# Patient Record
Sex: Female | Born: 1975 | Race: White | Hispanic: No | Marital: Married | Smoking: Never smoker
Health system: Southern US, Community
[De-identification: ages and names within clinical notes are randomized; demographics above are authoritative.]

## PROBLEM LIST (undated history)

## (undated) DIAGNOSIS — N946 Dysmenorrhea, unspecified: Secondary | ICD-10-CM

## (undated) DIAGNOSIS — T7840XA Allergy, unspecified, initial encounter: Secondary | ICD-10-CM

## (undated) DIAGNOSIS — E559 Vitamin D deficiency, unspecified: Secondary | ICD-10-CM

## (undated) DIAGNOSIS — R5383 Other fatigue: Secondary | ICD-10-CM

## (undated) DIAGNOSIS — B3731 Acute candidiasis of vulva and vagina: Secondary | ICD-10-CM

## (undated) DIAGNOSIS — N920 Excessive and frequent menstruation with regular cycle: Secondary | ICD-10-CM

## (undated) DIAGNOSIS — J45909 Unspecified asthma, uncomplicated: Secondary | ICD-10-CM

## (undated) DIAGNOSIS — K219 Gastro-esophageal reflux disease without esophagitis: Secondary | ICD-10-CM

## (undated) DIAGNOSIS — E785 Hyperlipidemia, unspecified: Secondary | ICD-10-CM

## (undated) DIAGNOSIS — B373 Candidiasis of vulva and vagina: Secondary | ICD-10-CM

## (undated) HISTORY — DX: Gastro-esophageal reflux disease without esophagitis: K21.9

## (undated) HISTORY — DX: Unspecified asthma, uncomplicated: J45.909

## (undated) HISTORY — DX: Candidiasis of vulva and vagina: B37.3

## (undated) HISTORY — DX: Allergy, unspecified, initial encounter: T78.40XA

## (undated) HISTORY — DX: Dysmenorrhea, unspecified: N94.6

## (undated) HISTORY — DX: Hyperlipidemia, unspecified: E78.5

## (undated) HISTORY — DX: Other fatigue: R53.83

## (undated) HISTORY — DX: Excessive and frequent menstruation with regular cycle: N92.0

## (undated) HISTORY — DX: Vitamin D deficiency, unspecified: E55.9

## (undated) HISTORY — DX: Acute candidiasis of vulva and vagina: B37.31

---

## 1998-03-27 ENCOUNTER — Other Ambulatory Visit: Admission: RE | Admit: 1998-03-27 | Discharge: 1998-03-27 | Payer: Self-pay | Admitting: *Deleted

## 1998-05-20 ENCOUNTER — Other Ambulatory Visit: Admission: RE | Admit: 1998-05-20 | Discharge: 1998-05-20 | Payer: Self-pay | Admitting: Obstetrics and Gynecology

## 2007-05-09 ENCOUNTER — Inpatient Hospital Stay: Payer: Self-pay

## 2013-06-25 LAB — HM PAP SMEAR: HM Pap smear: NEGATIVE

## 2013-06-25 LAB — LIPID PANEL
CHOLESTEROL: 211 mg/dL — AB (ref 0–200)
HDL: 48 mg/dL (ref 35–70)
LDL Cholesterol: 127 mg/dL
Triglycerides: 178 mg/dL — AB (ref 40–160)

## 2015-06-04 ENCOUNTER — Encounter: Payer: Self-pay | Admitting: *Deleted

## 2015-06-05 ENCOUNTER — Encounter: Payer: Self-pay | Admitting: Obstetrics and Gynecology

## 2015-06-05 ENCOUNTER — Ambulatory Visit (INDEPENDENT_AMBULATORY_CARE_PROVIDER_SITE_OTHER): Payer: BC Managed Care – PPO | Admitting: Obstetrics and Gynecology

## 2015-06-05 ENCOUNTER — Other Ambulatory Visit: Payer: Self-pay | Admitting: Obstetrics and Gynecology

## 2015-06-05 VITALS — BP 107/76 | HR 60 | Ht 62.0 in | Wt 152.5 lb

## 2015-06-05 DIAGNOSIS — Z01419 Encounter for gynecological examination (general) (routine) without abnormal findings: Secondary | ICD-10-CM | POA: Diagnosis not present

## 2015-06-05 DIAGNOSIS — R5383 Other fatigue: Secondary | ICD-10-CM | POA: Diagnosis not present

## 2015-06-05 DIAGNOSIS — E663 Overweight: Secondary | ICD-10-CM

## 2015-06-05 MED ORDER — FLUCONAZOLE 150 MG PO TABS: 150.0000 mg | ORAL_TABLET | Freq: Once | ORAL | Status: DC

## 2015-06-05 NOTE — Progress Notes (Signed)
Subjective:   Cassandra Greene is a 39 y.o. 363P0 Caucasian female here for a routine well-woman exam.  No LMP recorded. Patient is not currently having periods (Reason: IUD).    Current complaints: weight gain & fatigue PCP: unknown       Does need labs  Social History: Sexual: heterosexual Marital Status: married Living situation: with family Occupation: nurse Tobacco/alcohol: no tobacco use Illicit drugs: no history of illicit drug use  The following portions of the patient's history were reviewed and updated as appropriate: allergies, current medications, past family history, past medical history, past social history, past surgical history and problem list.  Past Medical History Past Medical History  Diagnosis Date  . Heavy periods   . Painful menstrual periods   . Fatigue   . Vitamin D deficiency   . Yeast infection of the vagina     Past Surgical History History reviewed. No pertinent past surgical history.  Gynecologic History G3P0  No LMP recorded. Patient is not currently having periods (Reason: IUD). Contraception: none Last Pap: 2015. Results were: normal Last mammogram: 2015. Results were: normal  Obstetric History OB History  Gravida Para Term Preterm AB SAB TAB Ectopic Multiple Living  3         3    # Outcome Date GA Lbr Len/2nd Weight Sex Delivery Anes PTL Lv  3 Gravida 2008    M Vag-Spont   Y  2 Gravida 2005    M Vag-Spont   Y  1 Gravida 2003    M Vag-Spont   Y      Current Medications Current Outpatient Prescriptions on File Prior to Visit  Medication Sig Dispense Refill  . levonorgestrel (MIRENA) 20 MCG/24HR IUD 1 each by Intrauterine route once.    . ergocalciferol (VITAMIN D2) 50000 UNITS capsule Take 50,000 Units by mouth once a week.     No current facility-administered medications on file prior to visit.    Review of Systems Patient denies any headaches, blurred vision, shortness of breath, chest pain, abdominal pain, problems with  bowel movements, urination, or intercourse.  Objective:  BP 107/76 mmHg  Pulse 60  Ht 5\' 2"  (1.575 m)  Wt 152 lb 8 oz (69.174 kg)  BMI 27.89 kg/m2 Physical Exam  General:  Well developed, well nourished, no acute distress. She is alert and oriented x3. Skin:  Warm and dry Neck:  Midline trachea, no thyromegaly or nodules Cardiovascular: Regular rate and rhythm, no murmur heard Lungs:  Effort normal, all lung fields clear to auscultation bilaterally Breasts:  No dominant palpable mass, retraction, or nipple discharge Abdomen:  Soft, non tender, no hepatosplenomegaly or masses Pelvic:  External genitalia is normal in appearance.  The vagina is normal in appearance. The cervix is bulbous, no CMT.  Thin prep pap is not done . Uterus is felt to be normal size, shape, and contour.  No adnexal masses or tenderness noted. Extremities:  No swelling or varicosities noted Psych:  She has a normal mood and affect  Assessment:   Healthy well-woman exam Obesity Fatigue   Plan:  Labs obtained Desires weight loss plan we offer here- will return the week after christmas to start F/U 1 year for AE, or sooner if needed Mammogram NA  Cassandra Greene, CNM

## 2015-06-05 NOTE — Patient Instructions (Signed)
  Place annual gynecologic exam patient instructions here.  Thank you for enrolling in MyChart. Please follow the instructions below to securely access your online medical record. MyChart allows you to send messages to your doctor, view your test results, manage appointments, and more.   How Do I Sign Up? 1. In your Internet browser, go to [employer] and enter [email or web address]. 2. Click on the Sign Up Now link in the Sign In box. You will see the New Member Sign Up page. 3. Enter your MyChart Access Code exactly as it appears below. You will not need to use this code after you've completed the sign-up process. If you do not sign up before the expiration date, you must request a new code.  MyChart Access Code: [email or web address] Expires: 06/19/2015 12:48 PM  4. Enter your Social Security Number ([id number]) and Date of Birth (mm/dd/yyyy) as indicated and click Submit. You will be taken to the next sign-up page. 5. Create a MyChart ID. This will be your MyChart login ID and cannot be changed, so think of one that is secure and easy to remember. 6. Create a MyChart password. You can change your password at any time. 7. Enter your Password Reset Question and Answer. This can be used at a later time if you forget your password.  8. Enter your e-mail address. You will receive e-mail notification when new information is available in MyChart. 9. Click Sign Up. You can now view your medical record.   Additional Information Remember, MyChart is NOT to be used for urgent needs. For medical emergencies, dial 911.

## 2015-06-06 LAB — COMPREHENSIVE METABOLIC PANEL
A/G RATIO: 1.8 (ref 1.1–2.5)
ALBUMIN: 4.6 g/dL (ref 3.5–5.5)
ALK PHOS: 59 IU/L (ref 39–117)
ALT: 16 IU/L (ref 0–32)
AST: 12 IU/L (ref 0–40)
BUN / CREAT RATIO: 26 — AB (ref 8–20)
BUN: 18 mg/dL (ref 6–20)
Bilirubin Total: 1.1 mg/dL (ref 0.0–1.2)
CO2: 22 mmol/L (ref 18–29)
CREATININE: 0.7 mg/dL (ref 0.57–1.00)
Calcium: 9.3 mg/dL (ref 8.7–10.2)
Chloride: 99 mmol/L (ref 97–106)
GFR calc Af Amer: 127 mL/min/{1.73_m2} (ref >59)
GFR, EST NON AFRICAN AMERICAN: 110 mL/min/{1.73_m2} (ref >59)
GLOBULIN, TOTAL: 2.5 g/dL (ref 1.5–4.5)
Glucose: 82 mg/dL (ref 65–99)
POTASSIUM: 4.1 mmol/L (ref 3.5–5.2)
SODIUM: 136 mmol/L (ref 136–144)
Total Protein: 7.1 g/dL (ref 6.0–8.5)

## 2015-06-06 LAB — THYROID PANEL WITH TSH
FREE THYROXINE INDEX: 1.9 (ref 1.2–4.9)
T3 Uptake Ratio: 28 % (ref 24–39)
T4, Total: 6.7 ug/dL (ref 4.5–12.0)
TSH: 3.95 u[IU]/mL (ref 0.450–4.500)

## 2015-06-06 LAB — LIPID PANEL
CHOLESTEROL TOTAL: 231 mg/dL — AB (ref 100–199)
Chol/HDL Ratio: 4.4 ratio units (ref 0.0–4.4)
HDL: 52 mg/dL (ref >39)
LDL CALC: 147 mg/dL — AB (ref 0–99)
TRIGLYCERIDES: 160 mg/dL — AB (ref 0–149)
VLDL CHOLESTEROL CAL: 32 mg/dL (ref 5–40)

## 2015-06-06 LAB — VITAMIN D 25 HYDROXY (VIT D DEFICIENCY, FRACTURES): Vit D, 25-Hydroxy: 23.4 ng/mL — ABNORMAL LOW (ref 30.0–100.0)

## 2015-06-06 LAB — HEMOGLOBIN A1C
ESTIMATED AVERAGE GLUCOSE: 103 mg/dL
HEMOGLOBIN A1C: 5.2 % (ref 4.8–5.6)

## 2015-06-06 LAB — VITAMIN B12: Vitamin B-12: 566 pg/mL (ref 211–946)

## 2015-06-08 LAB — CYTOLOGY - PAP

## 2015-06-09 ENCOUNTER — Other Ambulatory Visit: Payer: Self-pay | Admitting: Obstetrics and Gynecology

## 2015-06-09 DIAGNOSIS — E7800 Pure hypercholesterolemia, unspecified: Secondary | ICD-10-CM | POA: Insufficient documentation

## 2015-06-09 DIAGNOSIS — E559 Vitamin D deficiency, unspecified: Secondary | ICD-10-CM | POA: Insufficient documentation

## 2015-06-10 ENCOUNTER — Telehealth: Payer: Self-pay | Admitting: *Deleted

## 2015-06-10 NOTE — Telephone Encounter (Signed)
-----   Message from Purcell NailsMelody N Shambley, [place] sent at 06/09/2015  2:57 PM EST ----- Please let her know her vit d is low- I sent in rx for weekly supplements. Will recheck labs in 3 months. Also please mail info on vit D def.  Lipid panel is still high- worse than last year- please work on lowering chol in diet, regular exercise like we discussed and will also recheck in 3 months (fasting). Make sure she is still planning to do weight loss plan her.  All other labs are good. Also remind her I want her to get on mychart.

## 2015-06-10 NOTE — Telephone Encounter (Signed)
Notified pt of results, mailed info on Vit d to pt, she voiced understanding

## 2015-06-23 ENCOUNTER — Ambulatory Visit (INDEPENDENT_AMBULATORY_CARE_PROVIDER_SITE_OTHER): Payer: BC Managed Care – PPO | Admitting: Obstetrics and Gynecology

## 2015-06-23 ENCOUNTER — Encounter: Payer: Self-pay | Admitting: Obstetrics and Gynecology

## 2015-06-23 VITALS — BP 107/68 | HR 78 | Wt 155.9 lb

## 2015-06-23 DIAGNOSIS — E669 Obesity, unspecified: Secondary | ICD-10-CM

## 2015-06-23 DIAGNOSIS — Z30433 Encounter for removal and reinsertion of intrauterine contraceptive device: Secondary | ICD-10-CM

## 2015-06-23 MED ORDER — PHENTERMINE HCL 37.5 MG PO TABS: 37.5000 mg | ORAL_TABLET | Freq: Every day | ORAL | Status: DC

## 2015-06-23 MED ORDER — CYANOCOBALAMIN 1000 MCG/ML IJ SOLN: 1000.0000 ug | Freq: Once | INTRAMUSCULAR | Status: DC

## 2015-06-23 NOTE — Patient Instructions (Signed)

## 2015-06-23 NOTE — Progress Notes (Signed)
Patient ID: Marcille BlancoJennifer L Alleyne, female   DOB: 04/21/1976, 39 y.o.   MRN: [id number]  Marcille BlancoJennifer L Vanallen is a 39 y.o. year old 153P0 Caucasian female who presents for removal and replacement of a Mirena IUD. She was given informed consent for removal and reinsertion of her Mirena. Her Mirena was placed 2010, No LMP recorded. Patient is not currently having periods (Reason: IUD)., and her pregnancy test today was negative.   The risks and benefits of the method and placement have been thouroughly reviewed with the patient and all questions were answered.  Specifically the patient is aware of failure rate of 06/998, expulsion of the IUD and of possible perforation.  The patient is aware of irregular bleeding due to the method and understands the incidence of irregular bleeding diminishes with time.  Signed copy of informed consent in chart.   No LMP recorded. Patient is not currently having periods (Reason: IUD). BP 107/68 mmHg  Pulse 78  Wt 155 lb 14.4 oz (70.716 kg) No results found for this or any previous visit (from the past 24 hour(s)).   Appropriate time out taken. A graves speculum was placed in the vagina.  The cervix was visualized, prepped using Betadine. The strings were visible. They were grasped and the Mirena was easily removed. The cervix was then grasped with a single-tooth tenaculum. The uterus was found to be neutral and it sounded to 8 cm.  Mirena IUD placed per manufacturer's recommendations without complications. The strings were trimmed to 3 cm.  The patient tolerated the procedure well.   The patient was given post procedure instructions, including signs and symptoms of infection and to check for the strings after each menses or each month, and refraining from intercourse or anything in the vagina for 3 days.  She was given a Mirena care card with date Mirena placed, and date Mirena to be removed.  She was also started on weight loss meds previously discussed with Adipex 37.5mg  daily  and B12 102600mcg/ml monthly- RTC in 4-5 weeks for wt check and B12.  Rishab Stoudt Suzan NailerN Aneesha Holloran, CNM

## 2015-07-27 ENCOUNTER — Ambulatory Visit: Payer: BC Managed Care – PPO

## 2016-06-10 ENCOUNTER — Encounter: Payer: BC Managed Care – PPO | Admitting: Obstetrics and Gynecology

## 2016-07-07 ENCOUNTER — Ambulatory Visit (INDEPENDENT_AMBULATORY_CARE_PROVIDER_SITE_OTHER): Payer: BC Managed Care – PPO | Admitting: Obstetrics and Gynecology

## 2016-07-07 ENCOUNTER — Encounter: Payer: Self-pay | Admitting: Obstetrics and Gynecology

## 2016-07-07 VITALS — BP 112/84 | HR 81 | Ht 62.0 in | Wt 164.5 lb

## 2016-07-07 DIAGNOSIS — E669 Obesity, unspecified: Secondary | ICD-10-CM | POA: Diagnosis not present

## 2016-07-07 DIAGNOSIS — Z30431 Encounter for routine checking of intrauterine contraceptive device: Secondary | ICD-10-CM

## 2016-07-07 DIAGNOSIS — Z01419 Encounter for gynecological examination (general) (routine) without abnormal findings: Secondary | ICD-10-CM | POA: Diagnosis not present

## 2016-07-07 DIAGNOSIS — B3731 Acute candidiasis of vulva and vagina: Secondary | ICD-10-CM

## 2016-07-07 DIAGNOSIS — Z01411 Encounter for gynecological examination (general) (routine) with abnormal findings: Secondary | ICD-10-CM | POA: Diagnosis not present

## 2016-07-07 DIAGNOSIS — E782 Mixed hyperlipidemia: Secondary | ICD-10-CM

## 2016-07-07 DIAGNOSIS — B373 Candidiasis of vulva and vagina: Secondary | ICD-10-CM | POA: Diagnosis not present

## 2016-07-07 MED ORDER — BUPROPION HCL ER (XL) 150 MG PO TB24: 150.0000 mg | ORAL_TABLET | Freq: Every day | ORAL | 4 refills | Status: DC

## 2016-07-07 MED ORDER — TERCONAZOLE 0.4 % VA CREA: 1.0000 | TOPICAL_CREAM | Freq: Every day | VAGINAL | 0 refills | Status: DC

## 2016-07-07 NOTE — Patient Instructions (Signed)
 Preventive Care 18-39 Years, Female Preventive care refers to lifestyle choices and visits with your health care provider that can promote health and wellness. What does preventive care include?  A yearly physical exam. This is also called an annual well check.  Dental exams once or twice a year.  Routine eye exams. Ask your health care provider how often you should have your eyes checked.  Personal lifestyle choices, including:  Daily care of your teeth and gums.  Regular physical activity.  Eating a healthy diet.  Avoiding tobacco and drug use.  Limiting alcohol use.  Practicing safe sex.  Taking vitamin and mineral supplements as recommended by your health care provider. What happens during an annual well check? The services and screenings done by your health care provider during your annual well check will depend on your age, overall health, lifestyle risk factors, and family history of disease. Counseling  Your health care provider may ask you questions about your:  Alcohol use.  Tobacco use.  Drug use.  Emotional well-being.  Home and relationship well-being.  Sexual activity.  Eating habits.  Work and work environment.  Method of birth control.  Menstrual cycle.  Pregnancy history. Screening  You may have the following tests or measurements:  Height, weight, and BMI.  Diabetes screening. This is done by checking your blood sugar (glucose) after you have not eaten for a while (fasting).  Blood pressure.  Lipid and cholesterol levels. These may be checked every 5 years starting at age 20.  Skin check.  Hepatitis C blood test.  Hepatitis B blood test.  Sexually transmitted disease (STD) testing.  BRCA-related cancer screening. This may be done if you have a family history of breast, ovarian, tubal, or peritoneal cancers.  Pelvic exam and Pap test. This may be done every 3 years starting at age 21. Starting at age 30, this may be done  every 5 years if you have a Pap test in combination with an HPV test. Discuss your test results, treatment options, and if necessary, the need for more tests with your health care provider. Vaccines  Your health care provider may recommend certain vaccines, such as:  Influenza vaccine. This is recommended every year.  Tetanus, diphtheria, and acellular pertussis (Tdap, Td) vaccine. You may need a Td booster every 10 years.  Varicella vaccine. You may need this if you have not been vaccinated.  HPV vaccine. If you are 26 or younger, you may need three doses over 6 months.  Measles, mumps, and rubella (MMR) vaccine. You may need at least one dose of MMR. You may also need a second dose.  Pneumococcal 13-valent conjugate (PCV13) vaccine. You may need this if you have certain conditions and were not previously vaccinated.  Pneumococcal polysaccharide (PPSV23) vaccine. You may need one or two doses if you smoke cigarettes or if you have certain conditions.  Meningococcal vaccine. One dose is recommended if you are age 19-21 years and a first-year college student living in a residence hall, or if you have one of several medical conditions. You may also need additional booster doses.  Hepatitis A vaccine. You may need this if you have certain conditions or if you travel or work in places where you may be exposed to hepatitis A.  Hepatitis B vaccine. You may need this if you have certain conditions or if you travel or work in places where you may be exposed to hepatitis B.  Haemophilus influenzae type b (Hib) vaccine. You may need   this if you have certain risk factors. Talk to your health care provider about which screenings and vaccines you need and how often you need them. This information is not intended to replace advice given to you by your health care provider. Make sure you discuss any questions you have with your health care provider. Document Released: 08/09/2001 Document Revised:  03/02/2016 Document Reviewed: 04/14/2015 Elsevier Interactive Patient Education  2017 Elsevier Inc.  

## 2016-07-07 NOTE — Progress Notes (Signed)
Subjective:   Cassandra Greene is a 41 y.o. 673P0 Caucasian female here for a routine well-woman exam.  No LMP recorded. Patient is not currently having periods (Reason: IUD).    Current complaints: frustrated over weight gain, feels very emotional and breast tenderness, not sleeping well, no menses with IUD. 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  Past Medical History Past Medical History:  Diagnosis Date  . Fatigue   . Heavy periods   . Painful menstrual periods   . Vitamin D deficiency   . Yeast infection of the vagina     Past Surgical History History reviewed. No pertinent surgical history.  Gynecologic History G3P0  No LMP recorded. Patient is not currently having periods (Reason: IUD). Contraception: IUD Last Pap: 2016. Results were: normal   Obstetric History OB History  Gravida Para Term Preterm AB Living  3         3  SAB TAB Ectopic Multiple Live Births          3    # Outcome Date GA Lbr Len/2nd Weight Sex Delivery Anes PTL Lv  3 Gravida 2008    M Vag-Spont   LIV  2 Gravida 2005    M Vag-Spont   LIV  1 Gravida 2003    M Vag-Spont   LIV      Current Medications Current Outpatient Prescriptions on File Prior to Visit  Medication Sig Dispense Refill  . levonorgestrel (MIRENA) 20 MCG/24HR IUD 1 each by Intrauterine route once.    . cyanocobalamin (,VITAMIN B-12,) 1000 MCG/ML injection Inject 1 mL (1,000 mcg total) into the muscle once. (Patient not taking: Reported on 07/07/2016) 10 mL 1  . ergocalciferol (VITAMIN D2) 50000 UNITS capsule Take  50,000 Units by mouth once a week.    . fluconazole (DIFLUCAN) 150 MG tablet Take 1 tablet (150 mg total) by mouth once. Can take additional dose three days later if symptoms persist (Patient not taking: Reported on 07/07/2016) 1 tablet 3  . phentermine (ADIPEX-P) 37.5 MG tablet Take 1 tablet (37.5 mg total) by mouth daily before breakfast. (Patient not taking: Reported on 07/07/2016) 30 tablet 2   No current facility-administered medications on file prior to visit.     Review of Systems Patient denies any headaches, blurred vision, shortness of breath, chest pain, abdominal pain, problems with bowel movements, urination, or intercourse.  Objective:  BP 112/84   Pulse 81   Ht 5\' 2"  (1.575 m)   Wt 164 lb 8 oz (74.6 kg)   BMI 30.09 kg/m  Physical Exam  General:  Well developed, well nourished, no acute distress. She is alert and oriented x3. Skin:  Warm and dry Neck:  Midline trachea, no thyromegaly or nodules Cardiovascular: Regular rate and rhythm, no murmur heard Lungs:  Effort normal, all lung fields clear to auscultation bilaterally Breasts:  No dominant palpable mass, retraction, or nipple discharge Abdomen:  Soft, non tender, no hepatosplenomegaly or masses Pelvic:  External genitalia is normal in appearance.  The vagina is normal in appearance. The cervix is bulbous, no CMT, with increased green discharge, IUD string  noted.  Thin prep pap is not done . Uterus is felt to be normal size, shape, and contour.  No adnexal masses or tenderness noted.  Microscopic wet-mount exam shows negative for pathogens, normal epithelial cells, lactobacilli. Extremities:  No swelling or varicosities noted Psych:  She has a normal mood and affect  Assessment:   Healthy well-woman exam Obesity IUD check Yeast infection   Plan:  terazol 7 sent in Trial of wellbutrin for weight loss-will return in 6 weeks for recheck F/U 1 year for AE, or sooner if needed   Lorraina Spring Suzan Nailer, CNM

## 2016-07-09 LAB — NMR, LIPOPROFILE
Cholesterol: 224 mg/dL — ABNORMAL HIGH (ref 100–199)
HDL Cholesterol by NMR: 48 mg/dL (ref >39)
HDL Particle Number: 29.7 umol/L — ABNORMAL LOW (ref >=30.5)
LDL Particle Number: 1863 nmol/L — ABNORMAL HIGH (ref <1000)
LDL SIZE: 20.9 nm (ref >20.5)
LDL-C: 154 mg/dL — ABNORMAL HIGH (ref 0–99)
LP-IR Score: 55 — ABNORMAL HIGH (ref <=45)
SMALL LDL PARTICLE NUMBER: 887 nmol/L — AB (ref <=527)
TRIGLYCERIDES BY NMR: 109 mg/dL (ref 0–149)

## 2016-07-09 LAB — COMPREHENSIVE METABOLIC PANEL
A/G RATIO: 1.6 (ref 1.2–2.2)
ALT: 17 IU/L (ref 0–32)
AST: 19 IU/L (ref 0–40)
Albumin: 4.2 g/dL (ref 3.5–5.5)
Alkaline Phosphatase: 54 IU/L (ref 39–117)
BILIRUBIN TOTAL: 1 mg/dL (ref 0.0–1.2)
BUN/Creatinine Ratio: 27 — ABNORMAL HIGH (ref 9–23)
BUN: 19 mg/dL (ref 6–20)
CHLORIDE: 100 mmol/L (ref 96–106)
CO2: 21 mmol/L (ref 18–29)
Calcium: 9 mg/dL (ref 8.7–10.2)
Creatinine, Ser: 0.7 mg/dL (ref 0.57–1.00)
GFR calc Af Amer: 126 mL/min/{1.73_m2} (ref >59)
GFR calc non Af Amer: 109 mL/min/{1.73_m2} (ref >59)
Globulin, Total: 2.6 g/dL (ref 1.5–4.5)
Glucose: 83 mg/dL (ref 65–99)
POTASSIUM: 4.5 mmol/L (ref 3.5–5.2)
Sodium: 138 mmol/L (ref 134–144)
TOTAL PROTEIN: 6.8 g/dL (ref 6.0–8.5)

## 2016-07-09 LAB — TESTOSTERONE, FREE, TOTAL, SHBG
SEX HORMONE BINDING: 39.2 nmol/L (ref 24.6–122.0)
TESTOSTERONE FREE: 1.8 pg/mL (ref 0.0–4.2)
Testosterone: 22 ng/dL (ref 8–48)

## 2016-07-09 LAB — CBC
Hematocrit: 41.2 % (ref 34.0–46.6)
Hemoglobin: 14.4 g/dL (ref 11.1–15.9)
MCH: 30.4 pg (ref 26.6–33.0)
MCHC: 35 g/dL (ref 31.5–35.7)
MCV: 87 fL (ref 79–97)
PLATELETS: 265 10*3/uL (ref 150–379)
RBC: 4.74 x10E6/uL (ref 3.77–5.28)
RDW: 13.1 % (ref 12.3–15.4)
WBC: 7.3 10*3/uL (ref 3.4–10.8)

## 2016-07-09 LAB — VITAMIN D 25 HYDROXY (VIT D DEFICIENCY, FRACTURES): Vit D, 25-Hydroxy: 28 ng/mL — ABNORMAL LOW (ref 30.0–100.0)

## 2016-07-09 LAB — ESTRADIOL: ESTRADIOL: 219.2 pg/mL

## 2016-07-09 LAB — VITAMIN B12: Vitamin B-12: 491 pg/mL (ref 232–1245)

## 2016-07-09 LAB — THYROID PANEL WITH TSH
Free Thyroxine Index: 1.4 (ref 1.2–4.9)
T3 Uptake Ratio: 26 % (ref 24–39)
T4 TOTAL: 5.5 ug/dL (ref 4.5–12.0)
TSH: 2.43 u[IU]/mL (ref 0.450–4.500)

## 2016-07-09 LAB — DHEA-SULFATE: DHEA-SO4: 221.7 ug/dL (ref 57.3–279.2)

## 2016-07-09 LAB — HEMOGLOBIN A1C
Est. average glucose Bld gHb Est-mCnc: 94 mg/dL
Hgb A1c MFr Bld: 4.9 % (ref 4.8–5.6)

## 2016-07-09 LAB — PROLACTIN: PROLACTIN: 11.1 ng/mL (ref 4.8–23.3)

## 2016-07-09 LAB — FERRITIN: FERRITIN: 103 ng/mL (ref 15–150)

## 2016-07-12 ENCOUNTER — Other Ambulatory Visit: Payer: Self-pay | Admitting: Obstetrics and Gynecology

## 2016-07-12 MED ORDER — ERGOCALCIFEROL 1.25 MG (50000 UT) PO CAPS: 50000.0000 [IU] | ORAL_CAPSULE | ORAL | 2 refills | Status: DC

## 2016-08-16 ENCOUNTER — Other Ambulatory Visit: Payer: Self-pay | Admitting: Obstetrics and Gynecology

## 2016-08-16 ENCOUNTER — Telehealth: Payer: Self-pay | Admitting: Obstetrics and Gynecology

## 2016-08-16 MED ORDER — LIDOCAINE HCL 2 % EX GEL: 1.0000 "application " | CUTANEOUS | 2 refills | Status: DC | PRN

## 2016-08-16 MED ORDER — MUPIROCIN 2 % EX OINT: 1.0000 "application " | TOPICAL_OINTMENT | Freq: Two times a day (BID) | CUTANEOUS | 0 refills | Status: DC

## 2016-08-16 NOTE — Telephone Encounter (Signed)
pls advise

## 2016-08-16 NOTE — Telephone Encounter (Signed)
Left pt a detailed message

## 2016-08-16 NOTE — Telephone Encounter (Signed)
Please let her know I sent in a prescription for antibiotic gel & lidocaine gel, please let her know if it is not markedly better by end of week I want to see her to check it out.

## 2016-08-16 NOTE — Telephone Encounter (Signed)
Pr called saying she had a bump on her labia this am.  She popped the bump and now it is red, painful and swollen.  She is in court for the next two days.  She would like to know if you can send something to the pharmacy.  She uses [facility].    Her call back is [phone]  thanks Barth Kirkseri

## 2016-08-19 ENCOUNTER — Encounter: Payer: BC Managed Care – PPO | Admitting: Obstetrics and Gynecology

## 2016-12-15 ENCOUNTER — Encounter: Payer: BC Managed Care – PPO | Admitting: Obstetrics and Gynecology

## 2017-05-31 ENCOUNTER — Ambulatory Visit (INDEPENDENT_AMBULATORY_CARE_PROVIDER_SITE_OTHER): Payer: BC Managed Care – PPO | Admitting: Obstetrics and Gynecology

## 2017-05-31 ENCOUNTER — Encounter: Payer: Self-pay | Admitting: Obstetrics and Gynecology

## 2017-05-31 VITALS — BP 117/84 | HR 93 | Wt 167.0 lb

## 2017-05-31 DIAGNOSIS — H9201 Otalgia, right ear: Secondary | ICD-10-CM | POA: Diagnosis not present

## 2017-05-31 DIAGNOSIS — J019 Acute sinusitis, unspecified: Secondary | ICD-10-CM | POA: Diagnosis not present

## 2017-05-31 NOTE — Patient Instructions (Signed)

## 2017-05-31 NOTE — Progress Notes (Signed)
Subjective:     Patient ID: Cassandra Greene, female   DOB: 03/26/1976, 41 y.o.   MRN: [id number]  HPI Reports onset sore throat and sinus pressure three days ago, followed by right ear pressure and pain, worse this am. Denies fever or green drainage. States son has same symptoms. No history of ear infections, just allergic sinus post nasal drip. Started Flonase last month per ENT doctor suggestion. Has taken motrin and sudafed with some help in symptoms.   Review of Systems Negative except stated above in HPI    Objective:   Physical Exam A&Ox4 Well groomed female Blood pressure 117/84, pulse 93, weight 167 lb (75.8 kg). Skin warm and dry Negative lymphadenopathy Throat slightly red with white patches  Nasal patches red but no drainage noted Ears both clear except bulging membranes    Assessment:     Ear ache secondary to viral sinusitis    Plan:     Switch to tylenol cold and sinus. Add netty pot use 2 times a day. Continue Flonase at this time. RTC if symptoms worsen or fever developes.  Cassandra Greene,CNM

## 2017-06-02 ENCOUNTER — Telehealth: Payer: Self-pay | Admitting: Obstetrics and Gynecology

## 2017-06-02 ENCOUNTER — Other Ambulatory Visit: Payer: Self-pay | Admitting: Obstetrics and Gynecology

## 2017-06-02 MED ORDER — CEFDINIR 300 MG PO CAPS: 300.0000 mg | ORAL_CAPSULE | Freq: Two times a day (BID) | ORAL | 1 refills | Status: DC

## 2017-06-02 NOTE — Telephone Encounter (Signed)
Patient called stating she is feeling worse. Her ears are stopped up more so now she has green mucous coming out her nose, as well as a low grade fever of 100. If an antibiotic can be called in she uses [facility]. Thanks

## 2017-06-02 NOTE — Telephone Encounter (Signed)
Notified pt. 

## 2017-06-02 NOTE — Telephone Encounter (Signed)
pls advise

## 2017-06-02 NOTE — Telephone Encounter (Signed)
I sent in a prescription

## 2017-07-14 ENCOUNTER — Encounter: Payer: BC Managed Care – PPO | Admitting: Obstetrics and Gynecology

## 2017-10-12 ENCOUNTER — Encounter: Payer: BC Managed Care – PPO | Admitting: Obstetrics and Gynecology

## 2017-12-08 ENCOUNTER — Ambulatory Visit: Payer: BC Managed Care – PPO | Admitting: Certified Nurse Midwife

## 2017-12-08 ENCOUNTER — Encounter: Payer: Self-pay | Admitting: Certified Nurse Midwife

## 2017-12-08 VITALS — BP 124/83 | HR 68 | Ht 62.0 in | Wt 167.0 lb

## 2017-12-08 DIAGNOSIS — J029 Acute pharyngitis, unspecified: Secondary | ICD-10-CM

## 2017-12-08 DIAGNOSIS — R490 Dysphonia: Secondary | ICD-10-CM

## 2017-12-08 DIAGNOSIS — R1310 Dysphagia, unspecified: Secondary | ICD-10-CM

## 2017-12-08 DIAGNOSIS — R239 Unspecified skin changes: Secondary | ICD-10-CM | POA: Diagnosis not present

## 2017-12-08 DIAGNOSIS — R635 Abnormal weight gain: Secondary | ICD-10-CM | POA: Diagnosis not present

## 2017-12-08 DIAGNOSIS — Z8349 Family history of other endocrine, nutritional and metabolic diseases: Secondary | ICD-10-CM | POA: Diagnosis not present

## 2017-12-08 NOTE — Progress Notes (Signed)
GYN ENCOUNTER NOTE  Subjective:       Cassandra Greene is a 42 y.o. 703P0 female is here evaluation of the following issues:  1. Family history of thyroid disorder 2. Skin changes 3. Weight changes 4. Throat soreness 5. Hoarseness 6. Intermittent dysphagia  Reports the above symptoms along with throat fullness for the last few months. No relief with home treatment measures.   Denies difficulty breathing or respiratory distress, chest pain, abdominal pain, and leg pain or swelling.    Gynecologic History  No LMP recorded. (Menstrual status: IUD).  Contraception: IUD, Mirena  Last Pap: 2016. Results were: normal  Last mammogram: due.   Obstetric History  OB History  Gravida Para Term Preterm AB Living  3         3  SAB TAB Ectopic Multiple Live Births          3    # Outcome Date GA Lbr Len/2nd Weight Sex Delivery Anes PTL Lv  3 Gravida 2008    M Vag-Spont   LIV  2 Gravida 2005    M Vag-Spont   LIV  1 Gravida 2003    M Vag-Spont   LIV    Past Medical History:  Diagnosis Date  . Fatigue   . Heavy periods   . Painful menstrual periods   . Vitamin D deficiency   . Yeast infection of the vagina     History reviewed. No pertinent surgical history.  Current Outpatient Medications on File Prior to Visit  Medication Sig Dispense Refill  . cetirizine (ZYRTEC) 10 MG tablet Take 10 mg by mouth daily.    Marland Kitchen. ibuprofen (ADVIL,MOTRIN) 200 MG tablet Take 200 mg by mouth every 6 (six) hours as needed.    Marland Kitchen. levonorgestrel (MIRENA) 20 MCG/24HR IUD 1 each by Intrauterine route once.    . ergocalciferol (VITAMIN D2) 50000 units capsule Take 1 capsule (50,000 Units total) by mouth once a week. (Patient not taking: Reported on 12/08/2017) 30 capsule 2   No current facility-administered medications on file prior to visit.     No Known Allergies  Social History   Socioeconomic History  . Marital status: Married    Spouse name: Not on file  . Number of children: Not on file  .  Years of education: Not on file  . Highest education level: Not on file  Occupational History  . Not on file  Social Needs  . Financial resource strain: Not on file  . Food insecurity:    Worry: Not on file    Inability: Not on file  . Transportation needs:    Medical: Not on file    Non-medical: Not on file  Tobacco Use  . Smoking status: Never Smoker  . Smokeless tobacco: Never Used  Substance and Sexual Activity  . Alcohol use: Yes  . Drug use: No  . Sexual activity: Yes    Birth control/protection: IUD    Comment: mirena  Lifestyle  . Physical activity:    Days per week: Not on file    Minutes per session: Not on file  . Stress: Not on file  Relationships  . Social connections:    Talks on phone: Not on file    Gets together: Not on file    Attends religious service: Not on file    Active member of club or organization: Not on file    Attends meetings of clubs or organizations: Not on file    Relationship status: Not  on file  . Intimate partner violence:    Fear of current or ex partner: Not on file    Emotionally abused: Not on file    Physically abused: Not on file    Forced sexual activity: Not on file  Other Topics Concern  . Not on file  Social History Narrative  . Not on file    Family History  Problem Relation Age of Onset  . Thyroid disease Mother   . Hypertension Mother   . Thyroid disease Father   . Hypertension Father   . Thyroid disease Maternal Aunt   . Thyroid disease Maternal Grandmother   . Hypertension Maternal Grandmother   . Thyroid disease Paternal Grandmother     The following portions of the patient's history were reviewed and updated as appropriate: allergies, current medications, past family history, past medical history, past social history, past surgical history and problem list.  Review of Systems  ROS negative except as noted above. Information obtained from patient.   Objective:   BP 124/83   Pulse 68   Ht 5\' 2"   (1.575 m)   Wt 167 lb (75.8 kg)   BMI 30.54 kg/m    CONSTITUTIONAL: Well-developed, well-nourished female in no acute distress.   NECK: Normal range of motion, supple, no masses.  Normal thyroid.   SKIN: Skin is warm and dry. No rash noted. Not diaphoretic. No erythema. No pallor.  NEUROLGIC: Alert and oriented to person, place, and time.   PSYCHIATRIC: Normal mood and affect. Normal behavior. Normal judgment and thought content. . Assessment:   1. Family history of thyroid disorder  - Thyroid Panel With TSH - Thyroglobulin Level - Thyroid peroxidase antibody - US THYROID; Future  2. Skin change  - Thyroid Panel With TSH - Thyroglobulin Level - Thyroid peroxidase antibody - US THYROID; Future  3. Weight gain  - Thyroid Panel With TSH - Thyroglobulin Level - Thyroid peroxidase antibody - US THYROID; Future  4. Throat soreness  - Thyroid Panel With TSH - Thyroglobulin Level - Thyroid peroxidase antibody - US THYROID; Future  5. Hoarseness  - Thyroid Panel With TSH - Thyroglobulin Level - Thyroid peroxidase antibody - US THYROID; Future  6. Dysphagia, unspecified type  - Thyroid Panel With TSH - Thyroglobulin Level - Thyroid peroxidase antibody - US THYROID; Future   Plan:   Labs: see orders.   Will schedule Thyroid US at [facility].   Reviewed red flag symptoms and when to call.   RTC as needed.    Gunnar Bulla, CNM [facility], [facility]

## 2017-12-08 NOTE — Patient Instructions (Signed)
Thyroxine Test Your thyroid is a butterfly-shaped gland located in the lower part of the front of your neck. Your thyroid makes chemical messengers (hormones) that help control your body's energy use. Thyroxine (T4) is the main hormone produced by your thyroid. Some T4 is bound to proteins in your blood. Some remains free (free T4). Your health care provider may test you for T4, free T4, or both. Your health care provider may order a thyroxine test if you have symptoms of an overactive (hyperthyroid) or underactive (hypothyroid) thyroid gland. Symptoms of hyperthyroidism include:  Tremors.  Weight loss.  Anxiety.  Heat intolerance.  Symptoms of hypothyroidism include:  Fatigue.  Weight gain.  Dry skin.  Cold intolerance.  If you have thyroid disease and you are being treated with thyroid hormone, you may have this test to check your thyroid hormone level. If you have thyroid disease and you are pregnant, you may have this test to make sure your hormone levels remain normal during pregnancy. Newborns often have a free T4 test to screen for hypothyroidism. This test requires a blood sample taken from a vein in your hand or arm. How do I prepare for this test? Let your health care provider know about all medicines you are taking, including vitamins, herbs, eye drops, creams, and over-the-counter medicines.  Many drugs can affect your thyroid hormones, including birth control pills and aspirin.  You may need to stop taking some drugs before the test.  Let your health care provider know about any medical conditions you have, such as:  Liver disease.  Any recent illness or stress.  If you are or may be pregnant.  What do the results mean? It is your responsibility to obtain your test results. Ask the lab or department performing the test when and how you will get your results. Contact your health care provider to discuss any questions you have about your results. Range of Normal  Values Ranges for normal values may vary among different labs and hospitals. You should always check with your health care provider after having lab work or other tests done to discuss whether your values are considered within normal limits. Free T4 is measured in nanograms per deciliter (ng/dL). The normal range for free T4 depends on age:  0-4 days: 2-6 ng/dL or 78-29 pmol/L (SI units).  2 weeks to 20 years: 0.8-2 ng/dL or 56-21 pmol/L (SI units).  Adult: 0.8-2.8 ng/dL or 30-86 pmol/L (SI units).  T4 is measured in micrograms per deciliter (mcg/dL). The normal range for T4 depends on age and gender:  1-3 days: 11-22 mcg/dL.  1-2 weeks: 10-16 mcg/dL.  1-12 months: 8-16 mcg/dL.  1-5 years: 7-15 mcg/dL.  5-10 years: 6-13 mcg/dL.  10-15 years: 5-12 mcg/dL.  Adult female: 4-12 mcg/dL or 57-846 nmol/L (SI units).  Adult female: 5-12 mcg/dL or 96-295 nmol/L (SI units).  Any adult older than 60 years: 5-11 mcg/dL or 28-413 nmol/L (SI units).  Meaning of Results Outside Normal Value Ranges Thyroxine levels alone may not provide enough information to diagnose a specific condition. In general:  A high level of thyroxine can indicate hyperthyroidism.  A low level of thyroxine can indicate hypothyroidism.  Discuss the meaning of results outside the normal range with your health care provider. You may need to have additional tests to help your health care provider make a diagnosis. Talk with your health care provider to discuss your results, treatment options, and if necessary, the need for more tests. Talk with your health care  provider if you have any questions about your results. This information is not intended to replace advice given to you by your health care provider. Make sure you discuss any questions you have with your health care provider. Document Released: 07/16/2004 Document Revised: 02/14/2016 Document Reviewed: 10/09/2013 Elsevier Interactive Patient Education  2018  [employer]. T3, Triiodothyronine Test Why am I having this test? The T3 test is used to evaluate thyroid function. This test helps monitor patients who are being treated for a thyroid disorder. It is also used to diagnose different thyroid conditions including hyperthyroidism and thyroiditis. What kind of sample is taken? A blood sample is required for this test. It is usually collected by inserting a needle into a vein. How do I prepare for this test? There is no preparation required for this test. What are the reference ranges? Reference ranges are considered healthy ranges established after testing a large group of healthy people. Reference ranges may vary among different people, labs, and hospitals. It is your responsibility to obtain your test results. Ask the lab or department performing the test when and how you will get your results. Reference ranges are based on age as follows:  1-3 days: 100-740 ng/dL.  1-11 months: 105-245 ng/dL.  1-5 years: 105-270 ng/dL.  6-10 years: 95-240 ng/dL.  11-15 years: 80-215 ng/dL.  16-20 years: 80-210 ng/dL.  20-50 years: 70-205 ng/dL.  Older than 50 years: 40-180 ng/dL.  What do the results mean? Increased levels of T3 may be seen with:  Hyperthyroidism.  Acute thyroiditis.  Taking too much thyroid supplementation.  Decreased levels of T3 may be seen with:  Hypothyroidism.  Pituitary gland abnormality.  Hypothalamus abnormality.  Malnutrition.  Liver disease.  Kidney disease.  Talk with your health care provider to discuss your results, treatment options, and if necessary, the need for more tests. Talk with your health care provider if you have any questions about your results. Talk with your health care provider to discuss your results, treatment options, and if necessary, the need for more tests. Talk with your health care provider if you have any questions about your results. This information is not intended to  replace advice given to you by your health care provider. Make sure you discuss any questions you have with your health care provider. Document Released: 07/16/2004 Document Revised: 02/11/2016 Document Reviewed: 11/06/2013 Elsevier Interactive Patient Education  2018 [employer]. Thyroid-Stimulating Hormone Test Why am I having this test? A thyroid-stimulating hormone (TSH) test is a blood test that is done to measure the level of TSH, also known as thyrotropin, in your blood. TSH is produced by the pituitary gland. The pituitary gland is a small organ located just below the brain, behind your eyes and nasal passages. It is part of a system that monitors and maintains thyroid hormone levels and thyroid gland function. Thyroid hormones affect many body parts and systems, including the system that affects how quickly your body burns fuel for energy. Your health care provider may recommend testing your TSH level if you have signs and symptoms of abnormal thyroid hormone levels. Knowing the level of TSH in your blood can help your health care provider:  Diagnose a thyroid gland or pituitary gland disorder.  Manage your condition and treatment if you have hypothyroidism or hyperthyroidism.  What kind of sample is taken? A blood sample is required for this test. It is usually collected by inserting a needle into a vein. How do I prepare for this test? There is  no preparation required for this test. What are the reference ranges? Reference rangesare considered healthy rangesestablished after testing a large group of healthy people. Reference rangesmay vary among different people, labs, and hospitals. It is your responsibility to obtain your test results. Ask the lab or department performing the test when and how you will get your results. Range of Normal Values:  Adult: 0.3-5 microunits/mL or 0.3-5 milliunits/L (SI units).  Newborn: 473-18 microunits/mL or 3-18 milliunits/L.  Cord: 3-12  microunits/mL or 3-12 milliunits/L.  What do the results mean? A high level of TSH may mean:  Your thyroid gland is not making enough thyroid hormones. When the thyroid gland does not make enough thyroid hormones, the pituitary gland releases TSH into the bloodstream. The higher-than-normal levels of TSH prompt the thyroid gland to release more thyroid hormones.  You are getting an insufficient level of thyroid hormone medicine, if you are receiving this type of treatment.  There is a problem with the pituitary gland (rare).  A low level of TSH can indicate a problem with the pituitary gland. Talk with your health care provider to discuss your results, treatment options, and if necessary, the need for more tests. Talk with your health care provider if you have any questions about your results. Talk with your health care provider to discuss your results, treatment options, and if necessary, the need for more tests. Talk with your health care provider if you have any questions about your results. This information is not intended to replace advice given to you by your health care provider. Make sure you discuss any questions you have with your health care provider. Document Released: 07/08/2004 Document Revised: 02/14/2016 Document Reviewed: 11/06/2013 Elsevier Interactive Patient Education  [employer]2018 Elsevier Inc.

## 2017-12-08 NOTE — Progress Notes (Signed)
Pt is present today due to thyroid issues. Pt has a family hx of thyroid disease on both side of her family mother and father. Pt stated that her skin has changed, wt gain, feeling like something is in her throat and throat soreness.

## 2017-12-15 ENCOUNTER — Encounter: Payer: Self-pay | Admitting: Certified Nurse Midwife

## 2017-12-15 LAB — THYROID PANEL WITH TSH
Free Thyroxine Index: 1.2 (ref 1.2–4.9)
T3 UPTAKE RATIO: 19 % — AB (ref 24–39)
T4 TOTAL: 6.2 ug/dL (ref 4.5–12.0)
TSH: 3.01 u[IU]/mL (ref 0.450–4.500)

## 2017-12-15 LAB — THYROGLOBULIN LEVEL: THYROGLOBULIN (TG-RIA): 16 ng/mL

## 2017-12-15 LAB — THYROID PEROXIDASE ANTIBODY: THYROID PEROXIDASE ANTIBODY: 8 [IU]/mL (ref 0–34)

## 2017-12-26 ENCOUNTER — Ambulatory Visit: Payer: BC Managed Care – PPO

## 2018-03-23 ENCOUNTER — Encounter: Payer: BC Managed Care – PPO | Admitting: Obstetrics and Gynecology

## 2018-03-23 ENCOUNTER — Encounter

## 2018-07-23 ENCOUNTER — Encounter: Payer: Self-pay | Admitting: *Deleted

## 2018-08-30 ENCOUNTER — Other Ambulatory Visit: Payer: Self-pay | Admitting: *Deleted

## 2018-08-30 ENCOUNTER — Telehealth: Payer: Self-pay | Admitting: Obstetrics and Gynecology

## 2018-08-30 ENCOUNTER — Ambulatory Visit (INDEPENDENT_AMBULATORY_CARE_PROVIDER_SITE_OTHER): Payer: BC Managed Care – PPO | Admitting: Obstetrics and Gynecology

## 2018-08-30 ENCOUNTER — Encounter: Payer: Self-pay | Admitting: Obstetrics and Gynecology

## 2018-08-30 ENCOUNTER — Other Ambulatory Visit (HOSPITAL_COMMUNITY)
Admission: RE | Admit: 2018-08-30 | Discharge: 2018-08-30 | Disposition: A | Discharge status: Home / Self Care | Payer: BC Managed Care – PPO | Source: Ambulatory Visit | Attending: Obstetrics and Gynecology | Admitting: Obstetrics and Gynecology

## 2018-08-30 VITALS — BP 127/86 | HR 74 | Ht 62.0 in | Wt 162.1 lb

## 2018-08-30 DIAGNOSIS — Z01419 Encounter for gynecological examination (general) (routine) without abnormal findings: Secondary | ICD-10-CM

## 2018-08-30 DIAGNOSIS — E559 Vitamin D deficiency, unspecified: Secondary | ICD-10-CM | POA: Diagnosis not present

## 2018-08-30 DIAGNOSIS — F419 Anxiety disorder, unspecified: Secondary | ICD-10-CM

## 2018-08-30 MED ORDER — FLUOXETINE HCL 10 MG PO CAPS: 10.0000 mg | ORAL_CAPSULE | Freq: Every day | ORAL | 3 refills | Status: DC

## 2018-08-30 NOTE — Telephone Encounter (Signed)
The patient remembered, on check out, that she needs her medication sent to [facility] instead of [facility], if that can be updated in her chart as well, please advise, thanks.

## 2018-08-30 NOTE — Progress Notes (Signed)
Subjective:   Cassandra Greene is a 43 y.o. G20P0 Caucasian female here for a routine well-woman exam.  No LMP recorded. (Menstrual status: IUD).    Current complaints: struggles with anxiety, job is stressful, has panic like episodes- feels like heart is racing, can't get her breath, fidgity- last for 15-30 minutes. Tends to be worse in the winter months.  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 Tobacco/alcohol: no tobacco use Illicit drugs: no history of illicit drug use  The following portions of the patient's history were reviewed and updated as appropriate: allergies, current medications, past family history, past medical history, past social history, past surgical history and problem list.  Past Medical History Past Medical History:  Diagnosis Date  . Fatigue   . Heavy periods   . Painful menstrual periods   . Vitamin D deficiency   . Yeast infection of the vagina     Past Surgical History No past surgical history on file.  Gynecologic History G3P0  No LMP recorded. (Menstrual status: IUD). Contraception: IUD(placed 05/2015) Last Pap: 2016. Results were: normal Last mammogram: ?. Results were: normal   Obstetric History OB History  Gravida Para Term Preterm AB Living  3         3  SAB TAB Ectopic Multiple Live Births          3    # Outcome Date GA Lbr Len/2nd Weight Sex Delivery Anes PTL Lv  3 Gravida 2008    M Vag-Spont   LIV  2 Gravida 2005    M Vag-Spont   LIV  1 Gravida 2003    M Vag-Spont   LIV    Current  Medications Current Outpatient Medications on File Prior to Visit  Medication Sig Dispense Refill  . levonorgestrel (MIRENA) 20 MCG/24HR IUD 1 each by Intrauterine route once.    . cetirizine (ZYRTEC) 10 MG tablet Take 10 mg by mouth daily.    . ergocalciferol (VITAMIN D2) 50000 units capsule Take 1 capsule (50,000 Units total) by mouth once a week. (Patient not taking: Reported on 12/08/2017) 30 capsule 2  . ibuprofen (ADVIL,MOTRIN) 200 MG tablet Take 200 mg by mouth every 6 (six) hours as needed.     No current facility-administered medications on file prior to visit.     Review of Systems Patient denies any headaches, blurred vision, shortness of breath, chest pain, abdominal pain, problems with bowel movements, urination, or intercourse.  Objective:  BP 127/86   Pulse 74   Ht 5\' 2"  (1.575 m)   Wt 162 lb 1.6 oz (73.5 kg)   BMI 29.65 kg/m  Physical Exam  General:  Well developed, well nourished, no acute distress. She is alert and oriented x3. Skin:  Warm and dry Neck:  Midline trachea, no thyromegaly or nodules Cardiovascular: Regular rate and rhythm, no murmur heard Lungs:  Effort normal, all lung fields clear to auscultation bilaterally Breasts:  No dominant palpable mass, retraction, or nipple discharge Abdomen:  Soft, non  tender, no hepatosplenomegaly or masses Pelvic:  External genitalia is normal in appearance.  The vagina is normal in appearance. The cervix is bulbous, no CMT.  Thin prep pap is done with HR HPV cotesting. Uterus is felt to be normal size, shape, and contour.  No adnexal masses or tenderness noted. IUD string noted Extremities:  No swelling or varicosities noted Psych:  She has a normal mood and affect  Assessment:   Healthy well-woman exam IUD check Anxiety Vit D deficiency  Plan:  Labs obtained- will follow up accordingly Will try prozac for anxiety F/U 1 year for AE, or sooner if needed Mammogram ordered  Jamisen Duerson Suzan Nailer, CNM

## 2018-08-30 NOTE — Telephone Encounter (Signed)
Done-ac 

## 2018-08-30 NOTE — Patient Instructions (Addendum)
 Preventive Care 18-39 Years, Female Preventive care refers to lifestyle choices and visits with your health care provider that can promote health and wellness. What does preventive care include?   A yearly physical exam. This is also called an annual well check.  Dental exams once or twice a year.  Routine eye exams. Ask your health care provider how often you should have your eyes checked.  Personal lifestyle choices, including: ? Daily care of your teeth and gums. ? Regular physical activity. ? Eating a healthy diet. ? Avoiding tobacco and drug use. ? Limiting alcohol use. ? Practicing safe sex. ? Taking vitamin and mineral supplements as recommended by your health care provider. What happens during an annual well check? The services and screenings done by your health care provider during your annual well check will depend on your age, overall health, lifestyle risk factors, and family history of disease. Counseling Your health care provider may ask you questions about your:  Alcohol use.  Tobacco use.  Drug use.  Emotional well-being.  Home and relationship well-being.  Sexual activity.  Eating habits.  Work and work environment.  Method of birth control.  Menstrual cycle.  Pregnancy history. Screening You may have the following tests or measurements:  Height, weight, and BMI.  Diabetes screening. This is done by checking your blood sugar (glucose) after you have not eaten for a while (fasting).  Blood pressure.  Lipid and cholesterol levels. These may be checked every 5 years starting at age 20.  Skin check.  Hepatitis C blood test.  Hepatitis B blood test.  Sexually transmitted disease (STD) testing.  BRCA-related cancer screening. This may be done if you have a family history of breast, ovarian, tubal, or peritoneal cancers.  Pelvic exam and Pap test. This may be done every 3 years starting at age 21. Starting at age 30, this may be done  every 5 years if you have a Pap test in combination with an HPV test. Discuss your test results, treatment options, and if necessary, the need for more tests with your health care provider. Vaccines Your health care provider may recommend certain vaccines, such as:  Influenza vaccine. This is recommended every year.  Tetanus, diphtheria, and acellular pertussis (Tdap, Td) vaccine. You may need a Td booster every 10 years.  Varicella vaccine. You may need this if you have not been vaccinated.  HPV vaccine. If you are 26 or younger, you may need three doses over 6 months.  Measles, mumps, and rubella (MMR) vaccine. You may need at least one dose of MMR. You may also need a second dose.  Pneumococcal 13-valent conjugate (PCV13) vaccine. You may need this if you have certain conditions and were not previously vaccinated.  Pneumococcal polysaccharide (PPSV23) vaccine. You may need one or two doses if you smoke cigarettes or if you have certain conditions.  Meningococcal vaccine. One dose is recommended if you are age 19-21 years and a first-year college student living in a residence hall, or if you have one of several medical conditions. You may also need additional booster doses.  Hepatitis A vaccine. You may need this if you have certain conditions or if you travel or work in places where you may be exposed to hepatitis A.  Hepatitis B vaccine. You may need this if you have certain conditions or if you travel or work in places where you may be exposed to hepatitis B.  Haemophilus influenzae type b (Hib) vaccine. You may need this if   you have certain risk factors. Talk to your health care provider about which screenings and vaccines you need and how often you need them. This information is not intended to replace advice given to you by your health care provider. Make sure you discuss any questions you have with your health care provider. Document Released: 08/09/2001 Document Revised:  01/24/2017 Document Reviewed: 04/14/2015 Elsevier Interactive Patient Education  2019 [employer].  Generalized Anxiety Disorder, Adult Generalized anxiety disorder (GAD) is a mental health disorder. People with this condition constantly worry about everyday events. Unlike normal anxiety, worry related to GAD is not triggered by a specific event. These worries also do not fade or get better with time. GAD interferes with life functions, including relationships, work, and school. GAD can vary from mild to severe. People with severe GAD can have intense waves of anxiety with physical symptoms (panic attacks). What are the causes? The exact cause of GAD is not known. What increases the risk? This condition is more likely to develop in:  Women.  People who have a family history of anxiety disorders.  People who are very shy.  People who experience very stressful life events, such as the death of a loved one.  People who have a very stressful family environment. What are the signs or symptoms? People with GAD often worry excessively about many things in their lives, such as their health and family. They may also be overly concerned about:  Doing well at work.  Being on time.  Natural disasters.  Friendships. Physical symptoms of GAD include:  Fatigue.  Muscle tension or having muscle twitches.  Trembling or feeling shaky.  Being easily startled.  Feeling like your heart is pounding or racing.  Feeling out of breath or like you cannot take a deep breath.  Having trouble falling asleep or staying asleep.  Sweating.  Nausea, diarrhea, or irritable bowel syndrome (IBS).  Headaches.  Trouble concentrating or remembering facts.  Restlessness.  Irritability. How is this diagnosed? Your health care provider can diagnose GAD based on your symptoms and medical history. You will also have a physical exam. The health care provider will ask specific questions about your  symptoms, including how severe they are, when they started, and if they come and go. Your health care provider may ask you about your use of alcohol or drugs, including prescription medicines. Your health care provider may refer you to a mental health specialist for further evaluation. Your health care provider will do a thorough examination and may perform additional tests to rule out other possible causes of your symptoms. To be diagnosed with GAD, a person must have anxiety that:  Is out of his or her control.  Affects several different aspects of his or her life, such as work and relationships.  Causes distress that makes him or her unable to take part in normal activities.  Includes at least three physical symptoms of GAD, such as restlessness, fatigue, trouble concentrating, irritability, muscle tension, or sleep problems. Before your health care provider can confirm a diagnosis of GAD, these symptoms must be present more days than they are not, and they must last for six months or longer. How is this treated? The following therapies are usually used to treat GAD:  Medicine. Antidepressant medicine is usually prescribed for long-term daily control. Antianxiety medicines may be added in severe cases, especially when panic attacks occur.  Talk therapy (psychotherapy). Certain types of talk therapy can be helpful in treating GAD by providing support,  education, and guidance. Options include: ? Cognitive behavioral therapy (CBT). People learn coping skills and techniques to ease their anxiety. They learn to identify unrealistic or negative thoughts and behaviors and to replace them with positive ones. ? Acceptance and commitment therapy (ACT). This treatment teaches people how to be mindful as a way to cope with unwanted thoughts and feelings. ? Biofeedback. This process trains you to manage your body's response (physiological response) through breathing techniques and relaxation methods. You  will work with a therapist while machines are used to monitor your physical symptoms.  Stress management techniques. These include yoga, meditation, and exercise. A mental health specialist can help determine which treatment is best for you. Some people see improvement with one type of therapy. However, other people require a combination of therapies. Follow these instructions at home:  Take over-the-counter and prescription medicines only as told by your health care provider.  Try to maintain a normal routine.  Try to anticipate stressful situations and allow extra time to manage them.  Practice any stress management or self-calming techniques as taught by your health care provider.  Do not punish yourself for setbacks or for not making progress.  Try to recognize your accomplishments, even if they are small.  Keep all follow-up visits as told by your health care provider. This is important. Contact a health care provider if:  Your symptoms do not get better.  Your symptoms get worse.  You have signs of depression, such as: ? A persistently sad, cranky, or irritable mood. ? Loss of enjoyment in activities that used to bring you joy. ? Change in weight or eating. ? Changes in sleeping habits. ? Avoiding friends or family members. ? Loss of energy for normal tasks. ? Feelings of guilt or worthlessness. Get help right away if:  You have serious thoughts about hurting yourself or others. If you ever feel like you may hurt yourself or others, or have thoughts about taking your own life, get help right away. You can go to your nearest emergency department or call:  Your local emergency services (911 in the [place]  A suicide crisis helpline, such as the [place] at 224-768-3927. This is open 24 hours a day. Summary  Generalized anxiety disorder (GAD) is a mental health disorder that involves worry that is not triggered by a specific event.  People  with GAD often worry excessively about many things in their lives, such as their health and family.  GAD may cause physical symptoms such as restlessness, trouble concentrating, sleep problems, frequent sweating, nausea, diarrhea, headaches, and trembling or muscle twitching.  A mental health specialist can help determine which treatment is best for you. Some people see improvement with one type of therapy. However, other people require a combination of therapies. This information is not intended to replace advice given to you by your health care provider. Make sure you discuss any questions you have with your health care provider. Document Released: 10/08/2012 Document Revised: 05/03/2016 Document Reviewed: 05/03/2016 Elsevier Interactive Patient Education  2019 [employer].

## 2018-08-31 LAB — COMPREHENSIVE METABOLIC PANEL
ALT: 20 IU/L (ref 0–32)
AST: 20 IU/L (ref 0–40)
Albumin/Globulin Ratio: 2 (ref 1.2–2.2)
Albumin: 4.6 g/dL (ref 3.8–4.8)
Alkaline Phosphatase: 56 IU/L (ref 39–117)
BUN/Creatinine Ratio: 21 (ref 9–23)
BUN: 16 mg/dL (ref 6–24)
Bilirubin Total: 1.2 mg/dL (ref 0.0–1.2)
CO2: 17 mmol/L — AB (ref 20–29)
Calcium: 9.2 mg/dL (ref 8.7–10.2)
Chloride: 105 mmol/L (ref 96–106)
Creatinine, Ser: 0.76 mg/dL (ref 0.57–1.00)
GFR calc Af Amer: 113 mL/min/{1.73_m2} (ref >59)
GFR calc non Af Amer: 98 mL/min/{1.73_m2} (ref >59)
Globulin, Total: 2.3 g/dL (ref 1.5–4.5)
Glucose: 77 mg/dL (ref 65–99)
Potassium: 4.1 mmol/L (ref 3.5–5.2)
Sodium: 138 mmol/L (ref 134–144)
Total Protein: 6.9 g/dL (ref 6.0–8.5)

## 2018-08-31 LAB — NMR, LIPOPROFILE
Cholesterol, Total: 209 mg/dL — ABNORMAL HIGH (ref 100–199)
HDL Particle Number: 30.5 umol/L (ref >=30.5)
HDL-C: 49 mg/dL (ref >39)
LDL Particle Number: 1651 nmol/L — ABNORMAL HIGH (ref <1000)
LDL Size: 21.1 nm (ref >20.5)
LDL-C: 143 mg/dL — ABNORMAL HIGH (ref 0–99)
LP-IR Score: 32 (ref <=45)
SMALL LDL PARTICLE NUMBER: 688 nmol/L — AB (ref <=527)
Triglycerides: 86 mg/dL (ref 0–149)

## 2018-08-31 LAB — HEMOGLOBIN A1C
Est. average glucose Bld gHb Est-mCnc: 103 mg/dL
Hgb A1c MFr Bld: 5.2 % (ref 4.8–5.6)

## 2018-08-31 LAB — TSH: TSH: 3.11 u[IU]/mL (ref 0.450–4.500)

## 2018-08-31 LAB — VITAMIN D 25 HYDROXY (VIT D DEFICIENCY, FRACTURES): Vit D, 25-Hydroxy: 20.9 ng/mL — ABNORMAL LOW (ref 30.0–100.0)

## 2018-09-04 ENCOUNTER — Other Ambulatory Visit: Payer: Self-pay | Admitting: Obstetrics and Gynecology

## 2018-09-04 LAB — CYTOLOGY - PAP
DIAGNOSIS: NEGATIVE
HPV (WINDOPATH): NOT DETECTED

## 2018-09-04 MED ORDER — ERGOCALCIFEROL 1.25 MG (50000 UT) PO CAPS: 50000.0000 [IU] | ORAL_CAPSULE | ORAL | 2 refills | Status: DC

## 2019-04-19 ENCOUNTER — Other Ambulatory Visit: Payer: Self-pay | Admitting: Obstetrics and Gynecology

## 2019-04-19 MED ORDER — FLUOXETINE HCL 20 MG PO CAPS: 20.0000 mg | ORAL_CAPSULE | Freq: Every day | ORAL | 6 refills | Status: DC

## 2019-08-22 ENCOUNTER — Telehealth: Payer: Self-pay | Admitting: Internal Medicine

## 2019-08-22 NOTE — Telephone Encounter (Signed)
Patient would like to est new patient care with Dr. Lorin Picket. Cassandra Greene's son and Dr. Roby Lofts son went to play school together and Cassandra Greene worked with Dr. Lorin Picket at [facility].

## 2019-08-22 NOTE — Telephone Encounter (Signed)
Message sent to provider 

## 2019-08-28 NOTE — Telephone Encounter (Signed)
Lm to call back and ask for Morrie Sheldon to schedule

## 2019-10-02 ENCOUNTER — Ambulatory Visit (INDEPENDENT_AMBULATORY_CARE_PROVIDER_SITE_OTHER): Payer: BC Managed Care – PPO | Admitting: Internal Medicine

## 2019-10-02 ENCOUNTER — Other Ambulatory Visit: Payer: Self-pay

## 2019-10-02 ENCOUNTER — Encounter: Payer: Self-pay | Admitting: Internal Medicine

## 2019-10-02 VITALS — BP 126/74 | HR 87 | Temp 98.3°F | Resp 16 | Ht 62.0 in | Wt 176.2 lb

## 2019-10-02 DIAGNOSIS — Z124 Encounter for screening for malignant neoplasm of cervix: Secondary | ICD-10-CM

## 2019-10-02 DIAGNOSIS — F439 Reaction to severe stress, unspecified: Secondary | ICD-10-CM | POA: Diagnosis not present

## 2019-10-02 DIAGNOSIS — Z9109 Other allergy status, other than to drugs and biological substances: Secondary | ICD-10-CM

## 2019-10-02 DIAGNOSIS — E559 Vitamin D deficiency, unspecified: Secondary | ICD-10-CM

## 2019-10-02 DIAGNOSIS — E7800 Pure hypercholesterolemia, unspecified: Secondary | ICD-10-CM | POA: Diagnosis not present

## 2019-10-02 DIAGNOSIS — Z1231 Encounter for screening mammogram for malignant neoplasm of breast: Secondary | ICD-10-CM

## 2019-10-02 LAB — COMPREHENSIVE METABOLIC PANEL
ALT: 16 U/L (ref 0–35)
AST: 19 U/L (ref 0–37)
Albumin: 4.3 g/dL (ref 3.5–5.2)
Alkaline Phosphatase: 48 U/L (ref 39–117)
BUN: 21 mg/dL (ref 6–23)
CO2: 25 mEq/L (ref 19–32)
Calcium: 9 mg/dL (ref 8.4–10.5)
Chloride: 105 mEq/L (ref 96–112)
Creatinine, Ser: 0.84 mg/dL (ref 0.40–1.20)
GFR: 74.02 mL/min (ref >60.00)
Glucose, Bld: 88 mg/dL (ref 70–99)
Potassium: 3.9 mEq/L (ref 3.5–5.1)
Sodium: 136 mEq/L (ref 135–145)
Total Bilirubin: 1.1 mg/dL (ref 0.2–1.2)
Total Protein: 6.9 g/dL (ref 6.0–8.3)

## 2019-10-02 LAB — CBC WITH DIFFERENTIAL/PLATELET
Basophils Absolute: 0 10*3/uL (ref 0.0–0.1)
Basophils Relative: 0.6 % (ref 0.0–3.0)
Eosinophils Absolute: 0.1 10*3/uL (ref 0.0–0.7)
Eosinophils Relative: 1.7 % (ref 0.0–5.0)
HCT: 42.2 % (ref 36.0–46.0)
Hemoglobin: 14.6 g/dL (ref 12.0–15.0)
Lymphocytes Relative: 42.6 % (ref 12.0–46.0)
Lymphs Abs: 2.7 10*3/uL (ref 0.7–4.0)
MCHC: 34.5 g/dL (ref 30.0–36.0)
MCV: 89.9 fl (ref 78.0–100.0)
Monocytes Absolute: 0.4 10*3/uL (ref 0.1–1.0)
Monocytes Relative: 6.2 % (ref 3.0–12.0)
Neutro Abs: 3.1 10*3/uL (ref 1.4–7.7)
Neutrophils Relative %: 48.9 % (ref 43.0–77.0)
Platelets: 270 10*3/uL (ref 150.0–400.0)
RBC: 4.7 Mil/uL (ref 3.87–5.11)
RDW: 13 % (ref 11.5–15.5)
WBC: 6.3 10*3/uL (ref 4.0–10.5)

## 2019-10-02 LAB — LIPID PANEL
Cholesterol: 227 mg/dL — ABNORMAL HIGH (ref 0–200)
HDL: 48.3 mg/dL (ref >39.00)
LDL Cholesterol: 148 mg/dL — ABNORMAL HIGH (ref 0–99)
NonHDL: 179.15
Total CHOL/HDL Ratio: 5
Triglycerides: 158 mg/dL — ABNORMAL HIGH (ref 0.0–149.0)
VLDL: 31.6 mg/dL (ref 0.0–40.0)

## 2019-10-02 LAB — TSH: TSH: 2.06 u[IU]/mL (ref 0.35–4.50)

## 2019-10-02 NOTE — Progress Notes (Signed)
Patient ID: Cassandra Greene, female   DOB: 1976/06/11, 44 y.o.   MRN: [id number]   Subjective:    Patient ID: Cassandra Greene, female    DOB: [birth year], 44 y.o.   MRN: [id number]  HPI This visit occurred during the SARS-CoV-2 public health emergency.  Safety protocols were in place, including screening questions prior to the visit, additional usage of staff PPE, and extensive cleaning of exam room while observing appropriate contact time as indicated for disinfecting solutions.  Patient here to establish care.  She previously saw [facility]. Has a history of heavy periods.  Had mirena IUD now.  No heavy periods now.  Documented history of acid reflux.  carbs aggravate.  Discussed diet and exercise.  Breathing stable.  No chest pain.  Had covid 06/2019.  No significant residual problems.  Did notice tingling face.  Sates has not occurred in two weeks.  Smell not back fully.  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  No abdominal pain.  Bowels moving.  Plans to establish with Dr Leafy Ro.  Some increased stress.  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    Past Medical History:  Diagnosis Date  . Allergy   . Asthma   . Fatigue   . GERD (gastroesophageal reflux disease)   . Heavy periods   . Hyperlipidemia   . Painful menstrual periods   . Vitamin D deficiency   . Yeast infection of the vagina    History reviewed. No pertinent surgical history. Family History  Problem Relation Age of Onset  . Thyroid disease Mother   . Hypertension Mother   . Cancer Mother   . Hyperlipidemia Mother   . Thyroid disease Father   . Hypertension Father   . Cancer Father   . Hyperlipidemia Father   . Hearing loss Father   . Thyroid disease Maternal Aunt   . Thyroid disease Maternal Grandmother   . Hypertension Maternal Grandmother   . Heart disease Maternal Grandmother   . Thyroid disease Paternal Grandmother   . Hypertension Brother   . Kidney disease Brother   . Alcohol abuse Maternal Grandfather     . Stroke Maternal Grandfather   . Heart disease Maternal Grandfather   . Hearing loss Paternal Grandfather   . Hypertension Paternal Grandfather   . Hyperlipidemia Paternal Grandfather    Social History   Socioeconomic History  . Marital status: Married    Spouse name: Not on file  . Number of children: 3  . Years of education: Not on file  . Highest education level: Bachelor's degree (e.g., BA, AB, BS)  Occupational History  . Not on file  Tobacco Use  . Smoking status: Never Smoker  . Smokeless tobacco: Never Used  Substance and Sexual Activity  . Alcohol use: Yes  . Drug use: No  . Sexual activity: Yes    Birth control/protection: I.U.D.    Comment: mirena  Other Topics Concern  . Not on file  Social History Narrative  . Not on file   Social Determinants of Health   Financial Resource Strain:   . Difficulty of Paying Living Expenses:   Food Insecurity:   . Worried About Charity fundraiser in the Last Year:   . Arboriculturist in the Last Year:   Transportation Needs:   . Film/video editor (Medical):   Marland Kitchen Lack of Transportation (Non-Medical):   Physical Activity:   . Days of Exercise per Week:   .  Minutes of Exercise per Session:   Stress:   . Feeling of Stress :   Social Connections:   . Frequency of Communication with Friends and Family:   . Frequency of Social Gatherings with Friends and Family:   . Attends Religious Services:   . Active Member of Clubs or Organizations:   . Attends Banker Meetings:   Marland Kitchen Marital Status:     Outpatient Encounter Medications as of 10/02/2019  Medication Sig  . ergocalciferol (VITAMIN D2) 1.25 MG (50000 UT) capsule Take 1 capsule (50,000 Units total) by mouth 2 (two) times a week.  Marland Kitchen FLUoxetine (PROZAC) 20 MG capsule Take 1 capsule (20 mg total) by mouth daily.  Marland Kitchen ibuprofen (ADVIL,MOTRIN) 200 MG tablet Take 200 mg by mouth every 6 (six) hours as needed.  Marland Kitchen levonorgestrel (MIRENA) 20 MCG/24HR IUD 1 each by  Intrauterine route once.  . [DISCONTINUED] cetirizine (ZYRTEC) 10 MG tablet Take 10 mg by mouth daily.   No facility-administered encounter medications on file as of 10/02/2019.    Review of Systems  Constitutional: Negative for appetite change and unexpected weight change.  HENT: Negative for congestion and sinus pressure.        Reports decreased hearing.  Also, smell not back.   Respiratory: Negative for cough, chest tightness and shortness of breath.   Cardiovascular: Negative for chest pain, palpitations and leg swelling.  Gastrointestinal: Negative for abdominal pain, diarrhea, nausea and vomiting.  Genitourinary: Negative for difficulty urinating and dysuria.  Musculoskeletal: Negative for joint swelling and myalgias.  Skin: Negative for color change and rash.  Neurological: Negative for dizziness, light-headedness and headaches.  Psychiatric/Behavioral: Negative for agitation and dysphoric mood.       Objective:    Physical Exam Vitals reviewed.  Constitutional:      General: She is not in acute distress.    Appearance: Normal appearance.  HENT:     Head: Normocephalic and atraumatic.     Right Ear: External ear normal.     Left Ear: External ear normal.  Eyes:     General: No scleral icterus.       Right eye: No discharge.        Left eye: No discharge.     Conjunctiva/sclera: Conjunctivae normal.  Neck:     Thyroid: No thyromegaly.  Cardiovascular:     Rate and Rhythm: Normal rate and regular rhythm.  Pulmonary:     Effort: No respiratory distress.     Breath sounds: Normal breath sounds. No wheezing.  Abdominal:     General: Bowel sounds are normal.     Palpations: Abdomen is soft.     Tenderness: There is no abdominal tenderness.  Musculoskeletal:        General: No swelling or tenderness.     Cervical back: Neck supple. No tenderness.  Lymphadenopathy:     Cervical: No cervical adenopathy.  Skin:    Findings: No erythema or rash.  Neurological:      Mental Status: She is alert.  Psychiatric:        Mood and Affect: Mood normal.        Behavior: Behavior normal.     BP 126/74   Pulse 87   Temp 98.3 F (36.8 C)   Resp 16   Ht 5\' 2"  (1.575 m)   Wt 176 lb 3.2 oz (79.9 kg)   SpO2 97%   BMI 32.23 kg/m  Wt Readings from Last 3 Encounters:  10/02/19 176 lb 3.2  oz (79.9 kg)  08/30/18 162 lb 1.6 oz (73.5 kg)  12/08/17 167 lb (75.8 kg)     Lab Results  Component Value Date   WBC 6.3 10/02/2019   HGB 14.6 10/02/2019   HCT 42.2 10/02/2019   PLT 270.0 10/02/2019   GLUCOSE 88 10/02/2019   CHOL 227 (H) 10/02/2019   TRIG 158.0 (H) 10/02/2019   HDL 48.30 10/02/2019   LDLCALC 148 (H) 10/02/2019   ALT 16 10/02/2019   AST 19 10/02/2019   NA 136 10/02/2019   K 3.9 10/02/2019   CL 105 10/02/2019   CREATININE 0.84 10/02/2019   BUN 21 10/02/2019   CO2 25 10/02/2019   TSH 2.06 10/02/2019   HGBA1C 5.2 08/30/2018       Assessment & Plan:   Problem List Items Addressed This Visit    Breast cancer screening    Was seen Melody Shambley.  Plans to f/u with Dr Dalbert Garnet. Plans to get mammograms through gyn.  Will let me know if a problem.        Cervical cancer screening    Plans to f/u with Dr Dalbert Garnet as outlined.        History of environmental allergies    Stable.  Follow.        Hypercholesterolemia - Primary    Low cholesterol diet and exercise.  Follow lipid panel.  On no medication.        Relevant Orders   CBC with Differential/Platelet (Completed)   Comprehensive metabolic panel (Completed)   TSH (Completed)   Lipid panel (Completed)   Stress    Increased stress.  Overall appears to be doing well.  Follow.        Vitamin D deficiency    Follow vitamin D level.           Dale [place], MD

## 2019-10-03 ENCOUNTER — Encounter: Payer: Self-pay | Admitting: Internal Medicine

## 2019-10-12 ENCOUNTER — Encounter: Payer: Self-pay | Admitting: Internal Medicine

## 2019-10-12 DIAGNOSIS — Z9109 Other allergy status, other than to drugs and biological substances: Secondary | ICD-10-CM | POA: Insufficient documentation

## 2019-10-12 DIAGNOSIS — F439 Reaction to severe stress, unspecified: Secondary | ICD-10-CM | POA: Insufficient documentation

## 2019-10-12 DIAGNOSIS — Z1239 Encounter for other screening for malignant neoplasm of breast: Secondary | ICD-10-CM | POA: Insufficient documentation

## 2019-10-12 DIAGNOSIS — Z124 Encounter for screening for malignant neoplasm of cervix: Secondary | ICD-10-CM | POA: Insufficient documentation

## 2019-10-12 NOTE — Assessment & Plan Note (Signed)
Was seen Cassandra Greene.  Plans to f/u with Dr Dalbert Garnet. Plans to get mammograms through gyn.  Will let me know if a problem.

## 2019-10-12 NOTE — Assessment & Plan Note (Signed)
Increased stress.  Overall appears to be doing well.  Follow.  

## 2019-10-12 NOTE — Assessment & Plan Note (Signed)
Stable.  Follow.   

## 2019-10-12 NOTE — Assessment & Plan Note (Signed)
Low cholesterol diet and exercise.  Follow lipid panel.  On no medication.   

## 2019-10-12 NOTE — Assessment & Plan Note (Signed)
Plans to f/u with Dr Dalbert Garnet as outlined.

## 2019-10-12 NOTE — Assessment & Plan Note (Signed)
Follow vitamin D level.  

## 2019-10-16 ENCOUNTER — Other Ambulatory Visit: Payer: Self-pay | Admitting: Obstetrics and Gynecology

## 2019-10-16 DIAGNOSIS — Z1231 Encounter for screening mammogram for malignant neoplasm of breast: Secondary | ICD-10-CM

## 2019-10-30 ENCOUNTER — Encounter: Payer: Self-pay | Admitting: Internal Medicine

## 2019-11-22 ENCOUNTER — Ambulatory Visit
Admission: RE | Admit: 2019-11-22 | Discharge: 2019-11-22 | Disposition: A | Discharge status: Home / Self Care | Payer: BC Managed Care – PPO | Source: Ambulatory Visit | Attending: Obstetrics and Gynecology | Admitting: Obstetrics and Gynecology

## 2019-11-22 DIAGNOSIS — Z1231 Encounter for screening mammogram for malignant neoplasm of breast: Secondary | ICD-10-CM

## 2019-11-22 IMAGING — MG DIGITAL SCREENING BILAT W/ TOMO W/ CAD
8 series · 8 of 24 positions shown · non-contrast
Comparison: None.

CLINICAL DATA: Screening.

EXAM:
DIGITAL SCREENING BILATERAL MAMMOGRAM WITH TOMO AND CAD

[R MLO synth-2D]
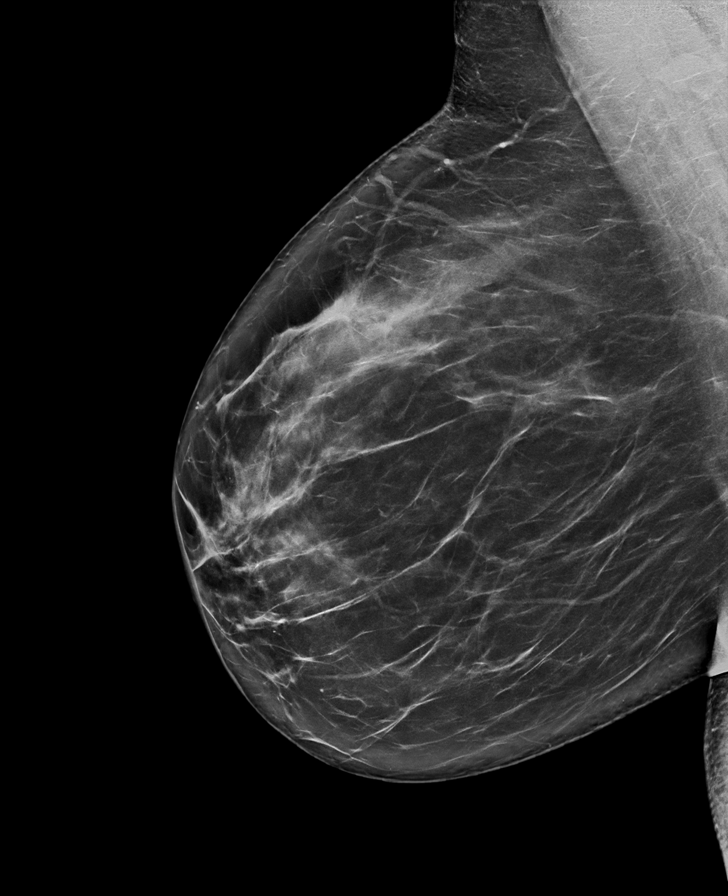

[L CC synth-2D]
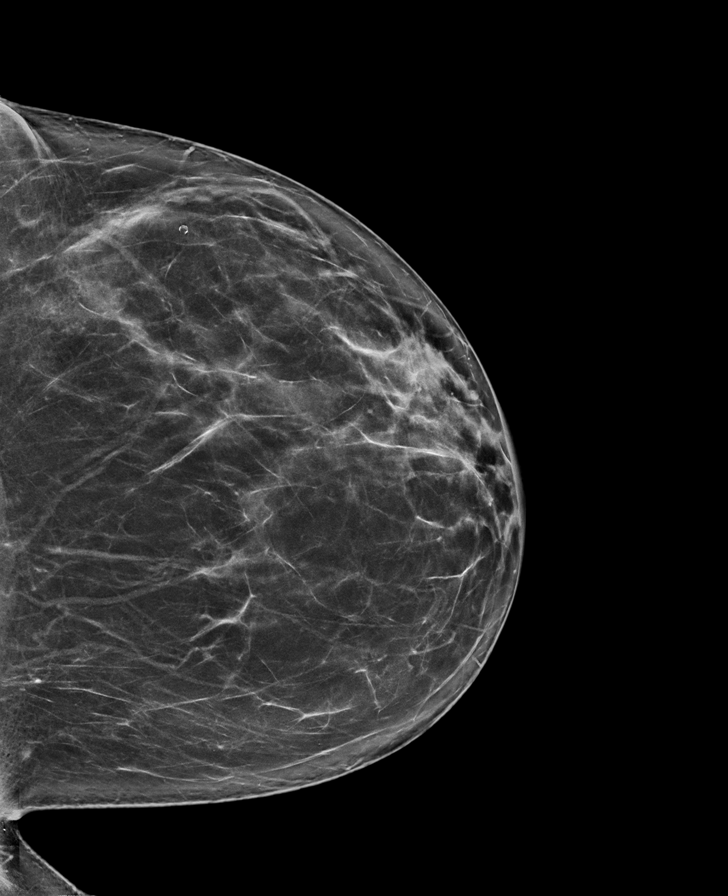

[R CC synth-2D]
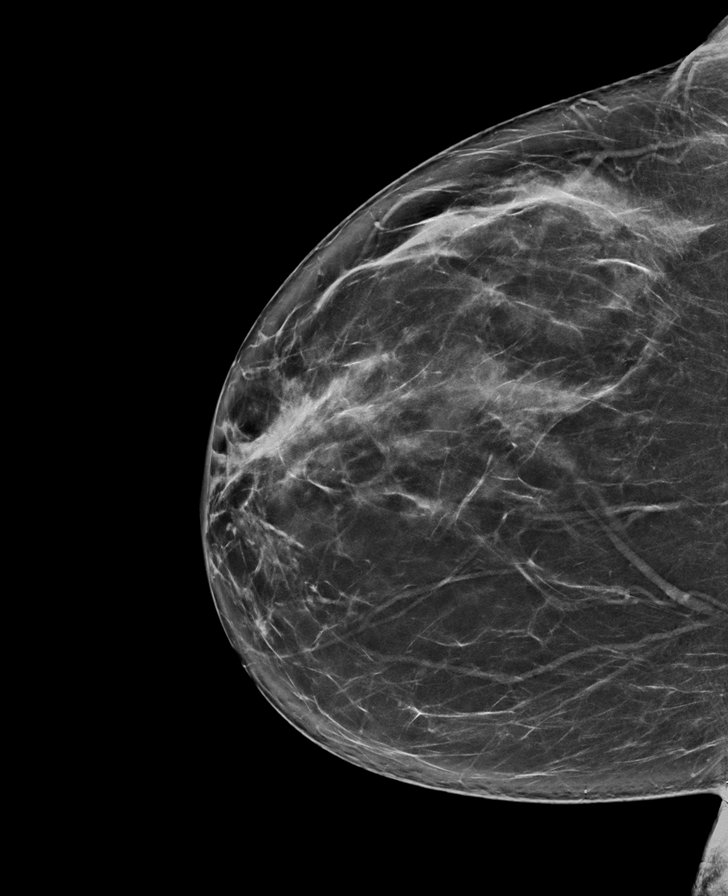

[L MLO synth-2D]
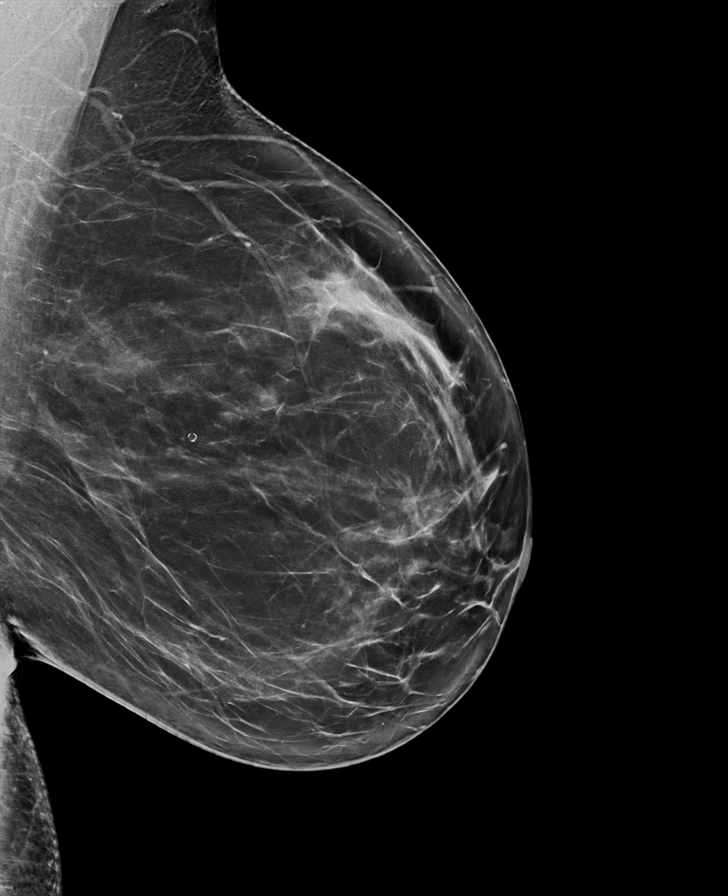

[R CC tomo · tomo slice 41/82.0]
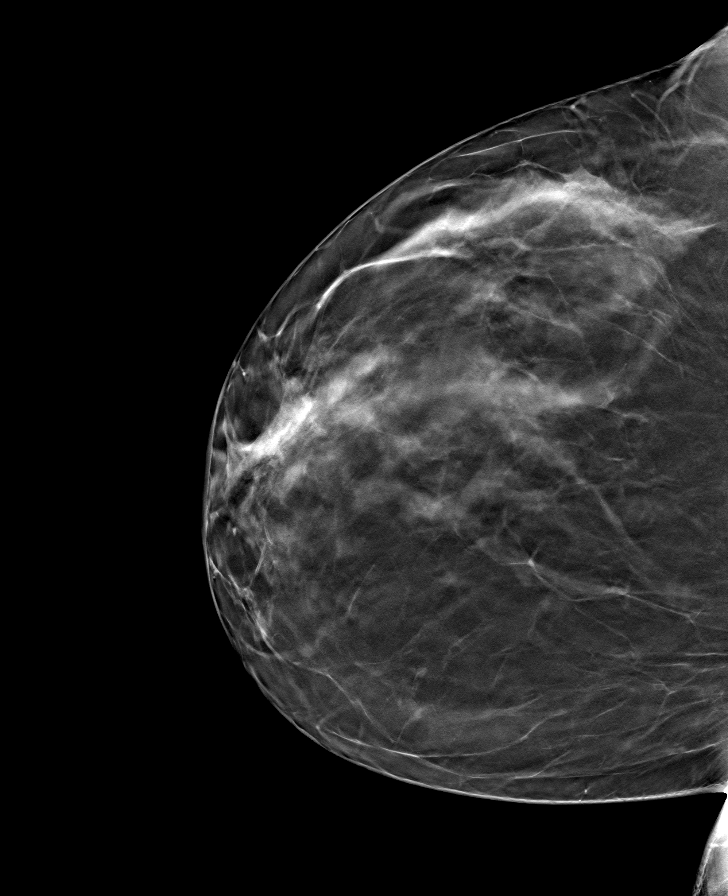

[L MLO tomo · tomo slice 45/90.0]
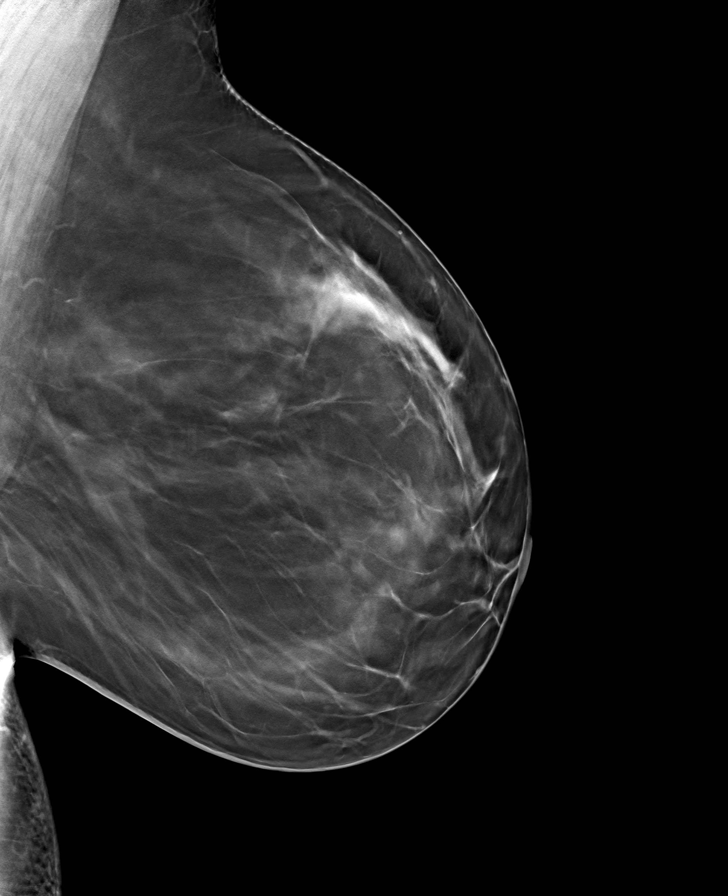

[L CC tomo · tomo slice 41/82.0]
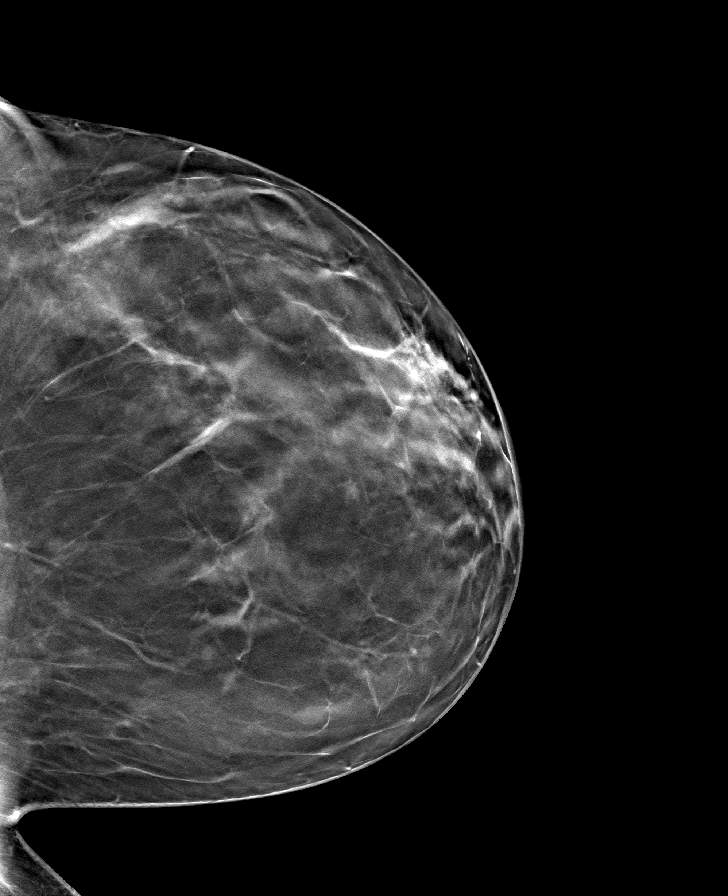

[R MLO tomo · tomo slice 47/93.0]
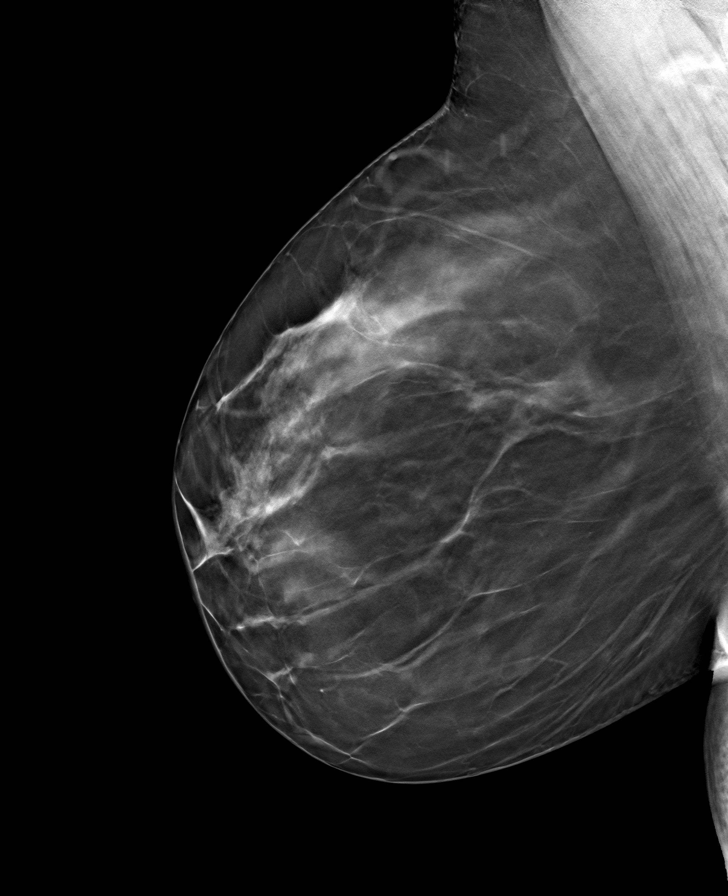

[8 of 24 positions shown; findings below may reference images not displayed]

ACR Breast Density Category c: The breast tissue is heterogeneously
dense, which may obscure small masses
FINDINGS: There are no findings suspicious for malignancy. Images were
processed with CAD.
IMPRESSION: No mammographic evidence of malignancy. A result letter of this
screening mammogram will be mailed directly to the patient.

RECOMMENDATION:
Screening mammogram in one year. (Code:EM-2-IHY)

BI-RADS CATEGORY  1: Negative.

## 2020-01-20 ENCOUNTER — Other Ambulatory Visit: Payer: Self-pay | Admitting: Internal Medicine

## 2020-04-03 ENCOUNTER — Other Ambulatory Visit: Payer: Self-pay

## 2020-04-03 ENCOUNTER — Ambulatory Visit (INDEPENDENT_AMBULATORY_CARE_PROVIDER_SITE_OTHER): Payer: BC Managed Care – PPO | Admitting: Internal Medicine

## 2020-04-03 DIAGNOSIS — Z124 Encounter for screening for malignant neoplasm of cervix: Secondary | ICD-10-CM | POA: Diagnosis not present

## 2020-04-03 DIAGNOSIS — F439 Reaction to severe stress, unspecified: Secondary | ICD-10-CM | POA: Diagnosis not present

## 2020-04-03 DIAGNOSIS — Z9109 Other allergy status, other than to drugs and biological substances: Secondary | ICD-10-CM

## 2020-04-03 DIAGNOSIS — E7800 Pure hypercholesterolemia, unspecified: Secondary | ICD-10-CM

## 2020-04-03 DIAGNOSIS — Z1231 Encounter for screening mammogram for malignant neoplasm of breast: Secondary | ICD-10-CM

## 2020-04-03 MED ORDER — FLUOXETINE HCL 40 MG PO CAPS: 40.0000 mg | ORAL_CAPSULE | Freq: Every day | ORAL | 3 refills | Status: DC

## 2020-04-03 NOTE — Progress Notes (Signed)
Patient ID: Cassandra Greene, female   DOB: 04/30/1976, 44 y.o.   MRN: [id number]   Subjective:    Patient ID: Cassandra Greene, female    DOB: 08-Jan-1976, 44 y.o.   MRN: [id number]  HPI This visit occurred during the SARS-CoV-2 public health emergency.  Safety protocols were in place, including screening questions prior to the visit, additional usage of staff PPE, and extensive cleaning of exam room while observing appropriate contact time as indicated for disinfecting solutions.  Patient here for a scheduled follow up.  She reports increased stress.  Work is stressful.  Discussed with her today.  Discussed treatment options.  She is on prozac.  Discussed increasing dose.  She has adjusted her diet.  Exercising.  Boot camp.  Has lost weight.  Feels better. No chest pain or sob reported - with increased activity or exercise.  No abdominal pain or bowel change reported.  Saw gyn 09/2019.  Stable.    Past Medical History:  Diagnosis Date  . Allergy   . Asthma   . Fatigue   . GERD (gastroesophageal reflux disease)   . Heavy periods   . Hyperlipidemia   . Painful menstrual periods   . Vitamin D deficiency   . Yeast infection of the vagina    History reviewed. No pertinent surgical history. Family History  Problem Relation Age of Onset  . Thyroid disease Mother   . Hypertension Mother   . Cancer Mother   . Hyperlipidemia Mother   . Thyroid disease Father   . Hypertension Father   . Cancer Father   . Hyperlipidemia Father   . Hearing loss Father   . Thyroid disease Maternal Aunt   . Thyroid disease Maternal Grandmother   . Hypertension Maternal Grandmother   . Heart disease Maternal Grandmother   . Thyroid disease Paternal Grandmother   . Hypertension Brother   . Kidney disease Brother   . Alcohol abuse Maternal Grandfather   . Stroke Maternal Grandfather   . Heart disease Maternal Grandfather   . Hearing loss Paternal Grandfather   . Hypertension Paternal Grandfather   .  Hyperlipidemia Paternal Grandfather   . Breast cancer Neg Hx    Social History   Socioeconomic History  . Marital status: Married    Spouse name: Not on file  . Number of children: 3  . Years of education: Not on file  . Highest education level: Bachelor's degree (e.g., BA, AB, BS)  Occupational History  . Not on file  Tobacco Use  . Smoking status: Never Smoker  . Smokeless tobacco: Never Used  Vaping Use  . Vaping Use: Never used  Substance and Sexual Activity  . Alcohol use: Yes  . Drug use: No  . Sexual activity: Yes    Birth control/protection: I.U.D.    Comment: mirena  Other Topics Concern  . Not on file  Social History Narrative  . Not on file   Social Determinants of Health   Financial Resource Strain:   . Difficulty of Paying Living Expenses: Not on file  Food Insecurity:   . Worried About Programme researcher, broadcasting/film/video in the Last Year: Not on file  . Ran Out of Food in the Last Year: Not on file  Transportation Needs:   . Lack of Transportation (Medical): Not on file  . Lack of Transportation (Non-Medical): Not on file  Physical Activity:   . Days of Exercise per Week: Not on file  . Minutes of Exercise per Session:  Not on file  Stress:   . Feeling of Stress : Not on file  Social Connections:   . Frequency of Communication with Friends and Family: Not on file  . Frequency of Social Gatherings with Friends and Family: Not on file  . Attends Religious Services: Not on file  . Active Member of Clubs or Organizations: Not on file  . Attends Banker Meetings: Not on file  . Marital Status: Not on file    Outpatient Encounter Medications as of 04/03/2020  Medication Sig  . ergocalciferol (VITAMIN D2) 1.25 MG (50000 UT) capsule Take 1 capsule (50,000 Units total) by mouth 2 (two) times a week.  Marland Kitchen FLUoxetine (PROZAC) 40 MG capsule Take 1 capsule (40 mg total) by mouth daily.  Marland Kitchen ibuprofen (ADVIL,MOTRIN) 200 MG tablet Take 200 mg by mouth every 6 (six)  hours as needed.  Marland Kitchen levonorgestrel (MIRENA) 20 MCG/24HR IUD 1 each by Intrauterine route once.  . [DISCONTINUED] FLUoxetine (PROZAC) 20 MG capsule Take 1 capsule (20 mg total) by mouth daily.   No facility-administered encounter medications on file as of 04/03/2020.    Review of Systems  Constitutional: Negative for appetite change and unexpected weight change.  HENT: Negative for congestion and sinus pressure.   Respiratory: Negative for cough, chest tightness and shortness of breath.   Cardiovascular: Negative for chest pain, palpitations and leg swelling.  Gastrointestinal: Negative for abdominal pain, diarrhea, nausea and vomiting.  Genitourinary: Negative for difficulty urinating and dysuria.  Musculoskeletal: Negative for joint swelling and myalgias.  Skin: Negative for color change and rash.  Neurological: Negative for dizziness, light-headedness and headaches.  Psychiatric/Behavioral: Negative for agitation and dysphoric mood.       Increased stress as outlined.         Objective:    Physical Exam Vitals reviewed.  Constitutional:      General: She is not in acute distress.    Appearance: Normal appearance.  HENT:     Head: Normocephalic and atraumatic.     Right Ear: External ear normal.     Left Ear: External ear normal.  Eyes:     General: No scleral icterus.       Right eye: No discharge.        Left eye: No discharge.     Conjunctiva/sclera: Conjunctivae normal.  Neck:     Thyroid: No thyromegaly.  Cardiovascular:     Rate and Rhythm: Normal rate and regular rhythm.     Pulses: Normal pulses.  Pulmonary:     Effort: No respiratory distress.     Breath sounds: Normal breath sounds. No wheezing.  Abdominal:     General: Bowel sounds are normal.     Palpations: Abdomen is soft.     Tenderness: There is no abdominal tenderness.  Musculoskeletal:        General: No swelling or tenderness.     Cervical back: Neck supple. No tenderness.  Lymphadenopathy:      Cervical: No cervical adenopathy.  Skin:    Findings: No erythema or rash.  Neurological:     Mental Status: She is alert.  Psychiatric:        Mood and Affect: Mood normal.        Behavior: Behavior normal.     BP 108/74   Pulse 75   Temp 98.1 F (36.7 C) (Oral)   Resp 16   Ht 5\' 2"  (1.575 m)   Wt 158 lb 6.4 oz (71.8 kg)  SpO2 98%   BMI 28.97 kg/m  Wt Readings from Last 3 Encounters:  04/03/20 158 lb 6.4 oz (71.8 kg)  10/02/19 176 lb 3.2 oz (79.9 kg)  08/30/18 162 lb 1.6 oz (73.5 kg)     Lab Results  Component Value Date   WBC 6.3 10/02/2019   HGB 14.6 10/02/2019   HCT 42.2 10/02/2019   PLT 270.0 10/02/2019   GLUCOSE 88 10/02/2019   CHOL 227 (H) 10/02/2019   TRIG 158.0 (H) 10/02/2019   HDL 48.30 10/02/2019   LDLCALC 148 (H) 10/02/2019   ALT 16 10/02/2019   AST 19 10/02/2019   NA 136 10/02/2019   K 3.9 10/02/2019   CL 105 10/02/2019   CREATININE 0.84 10/02/2019   BUN 21 10/02/2019   CO2 25 10/02/2019   TSH 2.06 10/02/2019   HGBA1C 5.2 08/30/2018    MM 3D SCREEN BREAST BILATERAL  Result Date: 11/22/2019 CLINICAL DATA:  Screening. EXAM: DIGITAL SCREENING BILATERAL MAMMOGRAM WITH TOMO AND CAD COMPARISON:  None. ACR Breast Density Category c: The breast tissue is heterogeneously dense, which may obscure small masses FINDINGS: There are no findings suspicious for malignancy. Images were processed with CAD. IMPRESSION: No mammographic evidence of malignancy. A result letter of this screening mammogram will be mailed directly to the patient. RECOMMENDATION: Screening mammogram in one year. (Code:SM-B-01Y) BI-RADS CATEGORY  1: Negative. Electronically Signed   By: Annia Belt M.D.   On: 11/22/2019 15:16       Assessment & Plan:   Problem List Items Addressed This Visit    Stress    Increased stress as outlined.  Discussed with her today.  Will increase prozac to 40mg  q day.  Follow.  Get her back in soon to reassess.        Hypercholesterolemia    Has  adjusted her diet.  Lost weight.  Is exercising.  Doing well.  Follow lipid panel.       History of environmental allergies    Stable.  Follow.       Cervical cancer screening    Continues f/u with Dr .        Breast cancer screening    Mammogram 11/22/19 - [facility] I.           11/24/19, MD

## 2020-04-05 ENCOUNTER — Encounter: Payer: Self-pay | Admitting: Internal Medicine

## 2020-04-05 NOTE — Assessment & Plan Note (Signed)
Mammogram 11/22/19 - [facility] I.

## 2020-04-05 NOTE — Assessment & Plan Note (Signed)
Continues f/u with Dr Dalbert Garnet.

## 2020-04-05 NOTE — Assessment & Plan Note (Signed)
Has adjusted her diet.  Lost weight.  Is exercising.  Doing well.  Follow lipid panel.

## 2020-04-05 NOTE — Assessment & Plan Note (Signed)
Stable.  Follow.   

## 2020-04-05 NOTE — Assessment & Plan Note (Signed)
Increased stress as outlined.  Discussed with her today.  Will increase prozac to 40mg  q day.  Follow.  Get her back in soon to reassess.

## 2020-05-08 ENCOUNTER — Other Ambulatory Visit: Payer: Self-pay | Admitting: Internal Medicine

## 2020-06-18 ENCOUNTER — Ambulatory Visit: Payer: BC Managed Care – PPO | Admitting: Internal Medicine

## 2020-07-06 ENCOUNTER — Encounter: Payer: Self-pay | Admitting: Internal Medicine

## 2020-07-06 ENCOUNTER — Telehealth (INDEPENDENT_AMBULATORY_CARE_PROVIDER_SITE_OTHER): Payer: Self-pay | Admitting: Internal Medicine

## 2020-07-06 ENCOUNTER — Other Ambulatory Visit: Payer: Self-pay

## 2020-07-06 DIAGNOSIS — E7800 Pure hypercholesterolemia, unspecified: Secondary | ICD-10-CM

## 2020-07-06 DIAGNOSIS — F439 Reaction to severe stress, unspecified: Secondary | ICD-10-CM

## 2020-07-06 NOTE — Progress Notes (Signed)
Patient ID: Cassandra Greene, female   DOB: [birth year], 45 y.o.   MRN: [id number]   Virtual Visit via video Note  This visit type was conducted due to national recommendations for restrictions regarding the COVID-19 pandemic (e.g. social distancing).  This format is felt to be most appropriate for this patient at this time.  All issues noted in this document were discussed and addressed.  No physical exam was performed (except for noted visual exam findings with Video Visits).   I connected with Cassandra Greene by a video enabled telemedicine application and verified that I am speaking with the correct person using two identifiers. Location patient: home Location provider: work  Persons participating in the virtual visit: patient, provider  The limitations, risks, security and privacy concerns of performing an evaluation and management Greene by video and the availability of in person appointments have been discussed it has also been discussed with the patient that there may be a patient responsible charge related to this Greene. The patient expressed understanding and agreed to proceed.   Reason for visit: scheduled follow up.   HPI: Follow up regarding increased stress and anxiety.  Last visit, her prozac was increase to 40mg  q day.  She does not feel this is helping.  Increased stress at work.  Discussed with her today. Discussed her work situation.  This is creating stress at home as well.  Being contacted 24/7 - with work issues.  She has noticed intermittent crying episodes.  Feels overwhelmed.  Sometimes feel she cannot function.  Did not feel like putting her Christmas tree up.  No significant depression.  Aunt passed away - Thanksgiving.  She is sleeping ok.  Just started back to the gym.  Went this am.  Plans to get back into her exercise routine and watching what she eats.   Has tried wellbutrin for a brief period in the past.   ROS: See pertinent positives and negatives per  HPI.  Past Medical History:  Diagnosis Date   Allergy    Asthma    Fatigue    GERD (gastroesophageal reflux disease)    Heavy periods    Hyperlipidemia    Painful menstrual periods    Vitamin D deficiency    Yeast infection of the vagina     History reviewed. No pertinent surgical history.  Family History  Problem Relation Age of Onset   Thyroid disease Mother    Hypertension Mother    Cancer Mother    Hyperlipidemia Mother    Thyroid disease Father    Hypertension Father    Cancer Father    Hyperlipidemia Father    Hearing loss Father    Thyroid disease Maternal Aunt    Thyroid disease Maternal Grandmother    Hypertension Maternal Grandmother    Heart disease Maternal Grandmother    Thyroid disease Paternal Grandmother    Hypertension Brother    Kidney disease Brother    Alcohol abuse Maternal Grandfather    Stroke Maternal Grandfather    Heart disease Maternal Grandfather    Hearing loss Paternal Grandfather    Hypertension Paternal Grandfather    Hyperlipidemia Paternal Grandfather    Breast cancer Neg Hx     SOCIAL HX: reviewed.    Current Outpatient Medications:    FLUoxetine (PROZAC) 40 MG capsule, Take 1 capsule (40 mg total) by mouth daily., Disp: 30 capsule, Rfl: 3   ibuprofen (ADVIL,MOTRIN) 200 MG tablet, Take 200 mg by mouth every 6 (six) hours as needed.,  Disp: , Rfl:    levonorgestrel (MIRENA) 20 MCG/24HR IUD, 1 each by Intrauterine route once., Disp: , Rfl:    Vitamin D, Ergocalciferol, (DRISDOL) 1.25 MG (50000 UNIT) CAPS capsule, TAKE 1 CAPSULE BY MOUTH TWICE PER WEEK, Disp: 30 capsule, Rfl: 2  EXAM:  GENERAL: alert, oriented, appears well and in no acute distress  HEENT: atraumatic, conjunttiva clear, no obvious abnormalities on inspection of external nose and ears  NECK: normal movements of the head and neck  LUNGS: on inspection no signs of respiratory distress, breathing rate appears normal, no obvious  gross SOB, gasping or wheezing  CV: no obvious cyanosis  PSYCH/NEURO: pleasant and cooperative, no obvious depression or anxiety, speech and thought processing grossly intact  ASSESSMENT AND PLAN:  Discussed the following assessment and plan:  Problem List Items Addressed This Visit    Hypercholesterolemia    Per last visit, has adjusted her diet and lost weight.  She has started back to the gym.  Follow lipid panel.       Stress    Increased stress, anxiety and possibly some mild depression as outlined.  Discussed her work stress.  On prozac 40mg  q day.  She feels she needs something more to help level things out.  Discussed treatment options.  Does not feel needs counseling at this time.  Has started back to the gym.  D/w psychiatry regarding medication adjustment.            I discussed the assessment and treatment plan with the patient. The patient was provided an opportunity to ask questions and all were answered. The patient agreed with the plan and demonstrated an understanding of the instructions.   The patient was advised to call back or seek an in-person evaluation if the symptoms worsen or if the condition fails to improve as anticipated.    , MD

## 2020-07-10 ENCOUNTER — Encounter: Payer: Self-pay | Admitting: Internal Medicine

## 2020-07-10 NOTE — Assessment & Plan Note (Signed)
Per last visit, has adjusted her diet and lost weight.  She has started back to the gym.  Follow lipid panel.

## 2020-07-10 NOTE — Assessment & Plan Note (Addendum)
Increased stress, anxiety and possibly some mild depression as outlined.  Discussed her work stress.  On prozac 40mg  q day.  She feels she needs something more to help level things out.  Discussed treatment options.  Does not feel needs counseling at this time.  Has started back to the gym.  D/w psychiatry regarding medication adjustment.

## 2020-07-12 ENCOUNTER — Telehealth: Payer: Self-pay | Admitting: Internal Medicine

## 2020-07-12 NOTE — Telephone Encounter (Signed)
My chart message sent to pt to start effexor and taper prozac.

## 2020-07-27 MED ORDER — VENLAFAXINE HCL ER 75 MG PO CP24: 75.0000 mg | ORAL_CAPSULE | Freq: Every day | ORAL | 1 refills | Status: DC

## 2020-07-27 NOTE — Addendum Note (Signed)
Addended by: Charm Barges on: 07/27/2020 08:15 PM   Modules accepted: Orders

## 2020-07-27 NOTE — Telephone Encounter (Signed)
Prescription sent in for effexor 75mg  q day #30 with one refill.

## 2020-07-28 MED ORDER — FLUOXETINE HCL 20 MG PO TABS: 20.0000 mg | ORAL_TABLET | Freq: Every day | ORAL | 0 refills | Status: DC

## 2020-07-28 NOTE — Addendum Note (Signed)
Addended by: Charm Barges on: 07/28/2020 07:26 AM   Modules accepted: Orders

## 2020-07-28 NOTE — Telephone Encounter (Signed)
rx sent in for prozac 20mg  to taper off.

## 2020-09-01 ENCOUNTER — Other Ambulatory Visit: Payer: Self-pay | Admitting: Internal Medicine

## 2020-09-02 ENCOUNTER — Other Ambulatory Visit: Payer: Self-pay

## 2020-09-02 ENCOUNTER — Telehealth (INDEPENDENT_AMBULATORY_CARE_PROVIDER_SITE_OTHER): Payer: Self-pay | Admitting: Internal Medicine

## 2020-09-02 ENCOUNTER — Encounter: Payer: Self-pay | Admitting: Internal Medicine

## 2020-09-02 DIAGNOSIS — F439 Reaction to severe stress, unspecified: Secondary | ICD-10-CM

## 2020-09-02 NOTE — Progress Notes (Signed)
Patient ID: Cassandra Greene, female   DOB: 1975/07/26, 45 y.o.   MRN: [id number]   Virtual Visit via video Note  This visit type was conducted due to national recommendations for restrictions regarding the COVID-19 pandemic (e.g. social distancing).  This format is felt to be most appropriate for this patient at this time.  All issues noted in this document were discussed and addressed.  No physical exam was performed (except for noted visual exam findings with Video Visits).   I connected with [name] by a video enabled telemedicine application and verified that I am speaking with the correct person using two identifiers. Location patient: home Location provider: work  Persons participating in the virtual visit: patient, provider  The limitations, risks, security and privacy concerns of performing an evaluation and management service by video and the availability of in person appointments have been discussed.  It has also been discussed with the patient that there may be a patient responsible charge related to this service. The patient expressed understanding and agreed to proceed.   Reason for visit: scheduled follow up.   HPI: Here to follow up for increased stress and anxiety.  Last visit, we discussed changing to effexor secondary to increased stress and anxiety.  She tapered off prozac.  On effexor now and doing well on this medication.  Work is better.  Some decreased stress with lifting of covid restrictions.  Sleeping ok.  Overall feels better.  No cough or congestion reported.  No abdominal pain reported.  Did report some change in her bowel movements.  Previously going q day, may now go q 2-3 days.  Does not feel is a significant issue.  Will notify me if any worsening symptoms or problems.      ROS: See pertinent positives and negatives per HPI.  Past Medical History:  Diagnosis Date  . Allergy   . Asthma   . Fatigue   . GERD (gastroesophageal reflux disease)   . Heavy  periods   . Hyperlipidemia   . Painful menstrual periods   . Vitamin D deficiency   . Yeast infection of the vagina     History reviewed. No pertinent surgical history.  Family History  Problem Relation Age of Onset  . Thyroid disease Mother   . Hypertension Mother   . Cancer Mother   . Hyperlipidemia Mother   . Thyroid disease Father   . Hypertension Father   . Cancer Father   . Hyperlipidemia Father   . Hearing loss Father   . Thyroid disease Maternal Aunt   . Thyroid disease Maternal Grandmother   . Hypertension Maternal Grandmother   . Heart disease Maternal Grandmother   . Thyroid disease Paternal Grandmother   . Hypertension Brother   . Kidney disease Brother   . Alcohol abuse Maternal Grandfather   . Stroke Maternal Grandfather   . Heart disease Maternal Grandfather   . Hearing loss Paternal Grandfather   . Hypertension Paternal Grandfather   . Hyperlipidemia Paternal Grandfather   . Breast cancer Neg Hx     SOCIAL HX: reviewed.    Current Outpatient Medications:  .  ibuprofen (ADVIL,MOTRIN) 200 MG tablet, Take 200 mg by mouth every 6 (six) hours as needed., Disp: , Rfl:  .  levonorgestrel (MIRENA) 20 MCG/24HR IUD, 1 each by Intrauterine route once., Disp: , Rfl:  .  venlafaxine XR (EFFEXOR-XR) 75 MG 24 hr capsule, TAKE 1 CAPSULE BY MOUTH ONCE DAILY WITH BREAKFAST, Disp: 30 capsule, Rfl: 1 .  Vitamin D, Ergocalciferol, (DRISDOL) 1.25 MG (50000 UNIT) CAPS capsule, TAKE 1 CAPSULE BY MOUTH TWICE PER WEEK, Disp: 30 capsule, Rfl: 2  EXAM:  GENERAL: alert, oriented, appears well and in no acute distress  HEENT: atraumatic, conjunttiva clear, no obvious abnormalities on inspection of external nose and ears  NECK: normal movements of the head and neck  LUNGS: on inspection no signs of respiratory distress, breathing rate appears normal, no obvious gross SOB, gasping or wheezing  CV: no obvious cyanosis  PSYCH/NEURO: pleasant and cooperative, no obvious  depression or anxiety, speech and thought processing grossly intact  ASSESSMENT AND PLAN:  Discussed the following assessment and plan:  Problem List Items Addressed This Visit    Stress    Previous increased stress and anxiety.  Off prozac now.  Started effexor.  Doing better on this medication.  Feels better.  Stress at work is better.  Continue on current dose of effexor.  Follow.            I discussed the assessment and treatment plan with the patient. The patient was provided an opportunity to ask questions and all were answered. The patient agreed with the plan and demonstrated an understanding of the instructions.   The patient was advised to call back or seek an in-person evaluation if the symptoms worsen or if the condition fails to improve as anticipated.   Dale [place], MD

## 2020-09-02 NOTE — Assessment & Plan Note (Signed)
Previous increased stress and anxiety.  Off prozac now.  Started effexor.  Doing better on this medication.  Feels better.  Stress at work is better.  Continue on current dose of effexor.  Follow.

## 2020-09-23 ENCOUNTER — Encounter: Payer: Self-pay | Admitting: Internal Medicine

## 2020-09-24 ENCOUNTER — Encounter: Payer: Self-pay | Admitting: Internal Medicine

## 2020-09-24 NOTE — Telephone Encounter (Signed)
Please confirm no other symptoms?  Confirm not taking any decongestants, antiinflammatories or any other medication that can elevate bp.  She would need to taper the effexor. She is on 75mg  q day.  Is a capsule, so unable to 1/2.  Can take effexor qod x 1 week and then q third day x 1 week and then stop. Monitor blood pressure and let me know how it is doing.

## 2020-09-24 NOTE — Telephone Encounter (Signed)
No other symptoms. She has taken ibuprofen occasionally but nothing persistent. She is going to taper off of the effexor and send in BP readings over the next couple of weeks.

## 2020-09-28 ENCOUNTER — Telehealth: Payer: Self-pay | Admitting: Cardiovascular Disease

## 2020-09-28 ENCOUNTER — Telehealth: Payer: Self-pay | Admitting: Internal Medicine

## 2020-09-28 ENCOUNTER — Telehealth: Payer: Self-pay | Admitting: Family

## 2020-09-28 ENCOUNTER — Ambulatory Visit: Payer: Self-pay | Admitting: Family

## 2020-09-28 ENCOUNTER — Encounter: Payer: Self-pay | Admitting: Family

## 2020-09-28 ENCOUNTER — Other Ambulatory Visit: Payer: Self-pay

## 2020-09-28 ENCOUNTER — Ambulatory Visit
Admission: RE | Admit: 2020-09-28 | Discharge: 2020-09-28 | Disposition: A | Discharge status: Home / Self Care | Payer: BC Managed Care – PPO | Source: Ambulatory Visit | Attending: Family | Admitting: Family

## 2020-09-28 VITALS — BP 140/92 | HR 72 | Temp 97.9°F | Ht 62.0 in | Wt 169.8 lb

## 2020-09-28 DIAGNOSIS — R079 Chest pain, unspecified: Secondary | ICD-10-CM

## 2020-09-28 DIAGNOSIS — R002 Palpitations: Secondary | ICD-10-CM | POA: Insufficient documentation

## 2020-09-28 LAB — TSH: TSH: 2.76 u[IU]/mL (ref 0.35–4.50)

## 2020-09-28 LAB — COMPREHENSIVE METABOLIC PANEL
ALT: 20 U/L (ref 0–35)
AST: 20 U/L (ref 0–37)
Albumin: 4.4 g/dL (ref 3.5–5.2)
Alkaline Phosphatase: 49 U/L (ref 39–117)
BUN: 21 mg/dL (ref 6–23)
CO2: 25 mEq/L (ref 19–32)
Calcium: 9.3 mg/dL (ref 8.4–10.5)
Chloride: 104 mEq/L (ref 96–112)
Creatinine, Ser: 0.71 mg/dL (ref 0.40–1.20)
GFR: 103.77 mL/min (ref >60.00)
Glucose, Bld: 80 mg/dL (ref 70–99)
Potassium: 4.1 mEq/L (ref 3.5–5.1)
Sodium: 137 mEq/L (ref 135–145)
Total Bilirubin: 0.9 mg/dL (ref 0.2–1.2)
Total Protein: 6.8 g/dL (ref 6.0–8.3)

## 2020-09-28 LAB — CBC WITH DIFFERENTIAL/PLATELET
Basophils Absolute: 0 10*3/uL (ref 0.0–0.1)
Basophils Relative: 0.6 % (ref 0.0–3.0)
Eosinophils Absolute: 0.1 10*3/uL (ref 0.0–0.7)
Eosinophils Relative: 1.8 % (ref 0.0–5.0)
HCT: 42.9 % (ref 36.0–46.0)
Hemoglobin: 14.5 g/dL (ref 12.0–15.0)
Lymphocytes Relative: 45.7 % (ref 12.0–46.0)
Lymphs Abs: 2.9 10*3/uL (ref 0.7–4.0)
MCHC: 33.9 g/dL (ref 30.0–36.0)
MCV: 90.1 fl (ref 78.0–100.0)
Monocytes Absolute: 0.4 10*3/uL (ref 0.1–1.0)
Monocytes Relative: 5.8 % (ref 3.0–12.0)
Neutro Abs: 2.9 10*3/uL (ref 1.4–7.7)
Neutrophils Relative %: 46.1 % (ref 43.0–77.0)
Platelets: 263 10*3/uL (ref 150.0–400.0)
RBC: 4.76 Mil/uL (ref 3.87–5.11)
RDW: 13 % (ref 11.5–15.5)
WBC: 6.4 10*3/uL (ref 4.0–10.5)

## 2020-09-28 LAB — MAGNESIUM: Magnesium: 2 mg/dL (ref 1.5–2.5)

## 2020-09-28 IMAGING — CT CT ANGIO CHEST
2 of 7 series · 15 of 36 positions shown · IV contrast (omnipaque)
Comparison: None.

CLINICAL DATA: Cardiac palpitations

EXAM:
CT ANGIOGRAPHY CHEST WITH CONTRAST
TECHNIQUE: Multidetector CT imaging of the chest was performed using the
standard protocol during bolus administration of intravenous
contrast. Multiplanar CT image reconstructions and MIPs were
obtained to evaluate the vascular anatomy.
CONTRAST:  75mL OMNIPAQUE IOHEXOL 350 MG/ML SOLN

[Series 6: thins cta pulmonary 1.00 · axial · 0.57mm/px · z∈[-1134,-909]mm · 14 of 325 slices shown]
[im 22/325  lung]
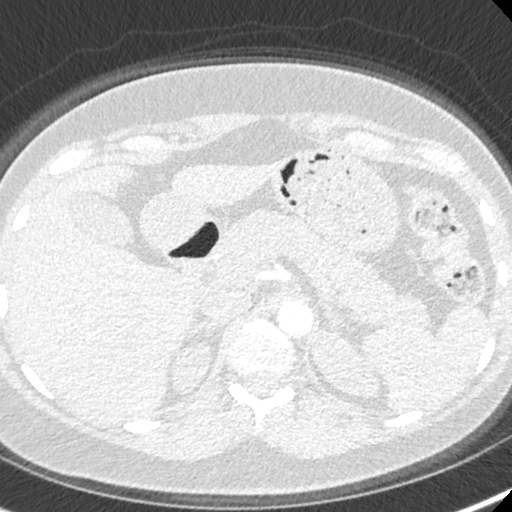
[im 44/325  mediastinal]
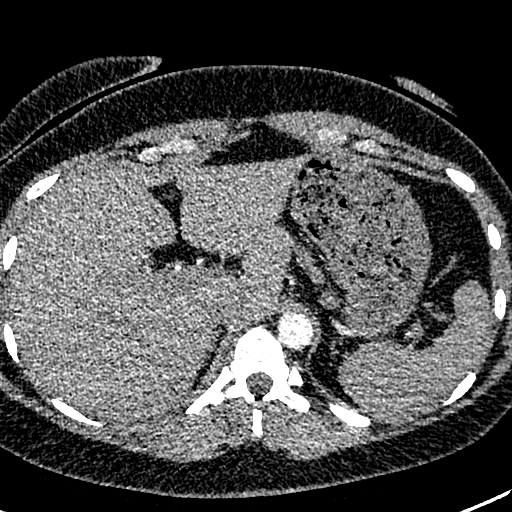
[im 65/325  lung]
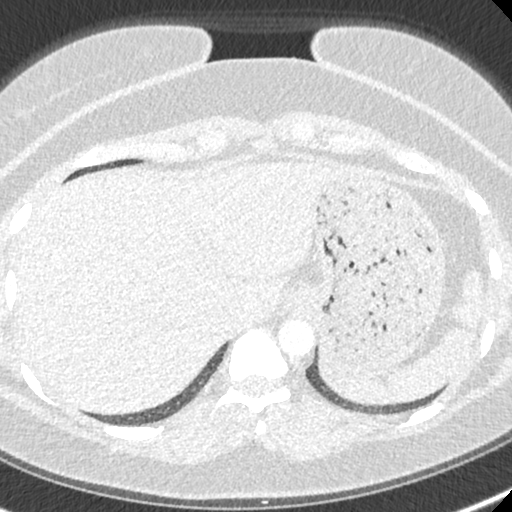
[im 87/325  mediastinal]
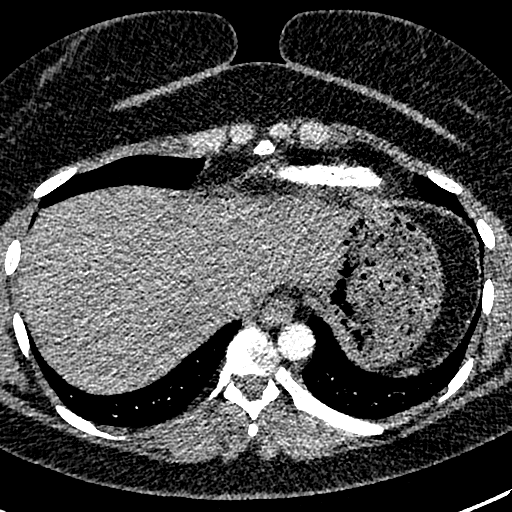
[im 109/325  lung]
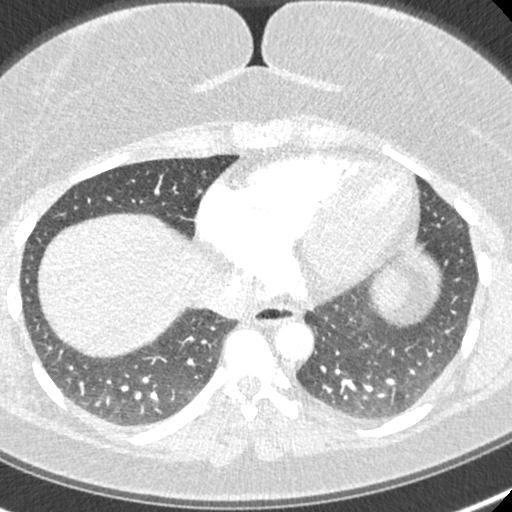
[im 130/325  mediastinal]
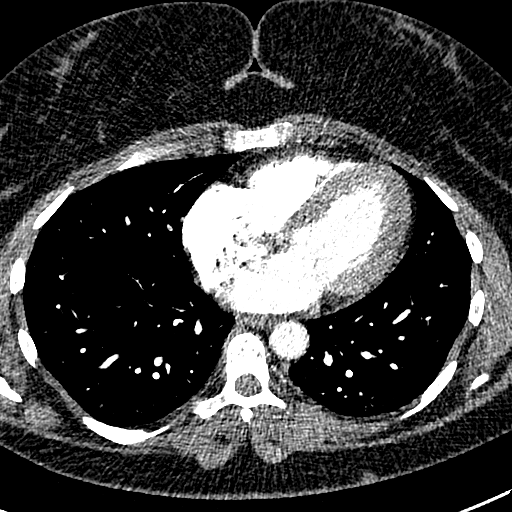
[im 152/325  lung]
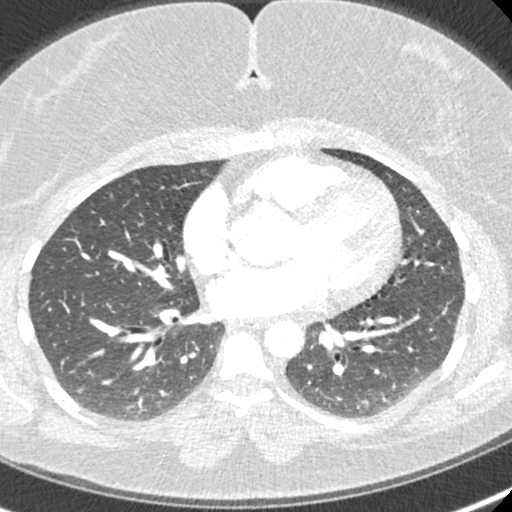
[im 173/325  mediastinal]
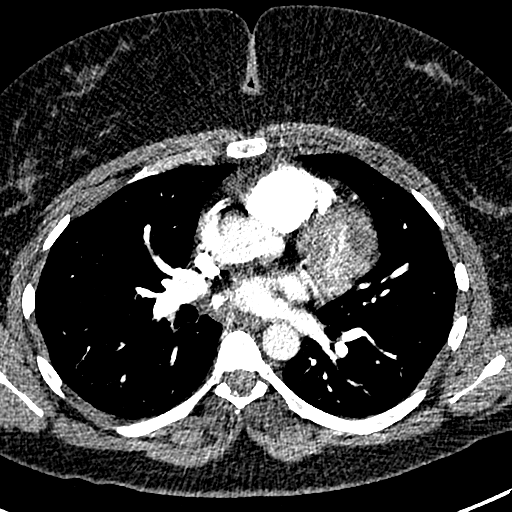
[im 195/325  lung]
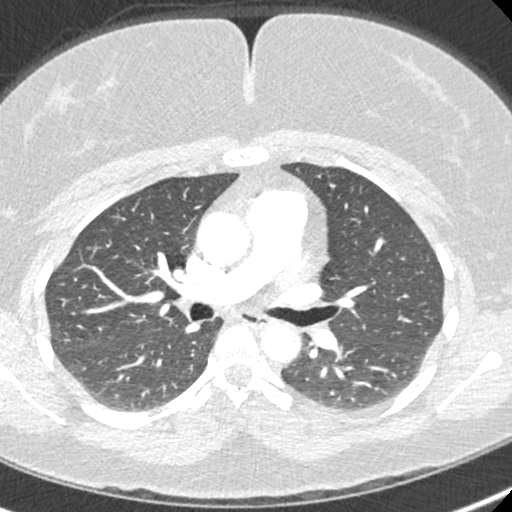
[im 217/325  mediastinal]
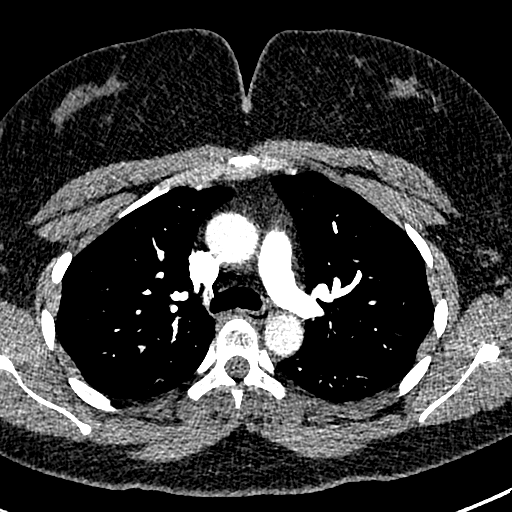
[im 238/325  lung]
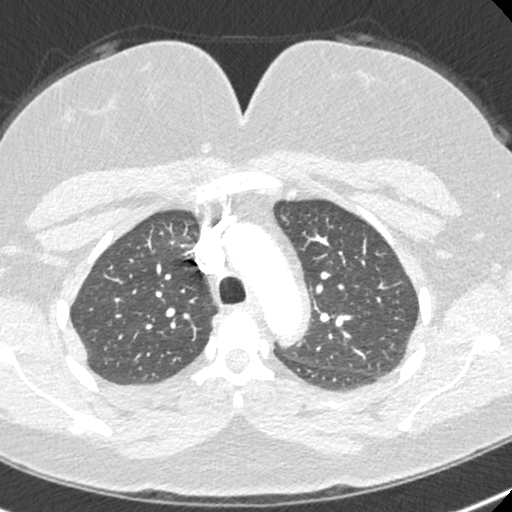
[im 260/325  mediastinal]
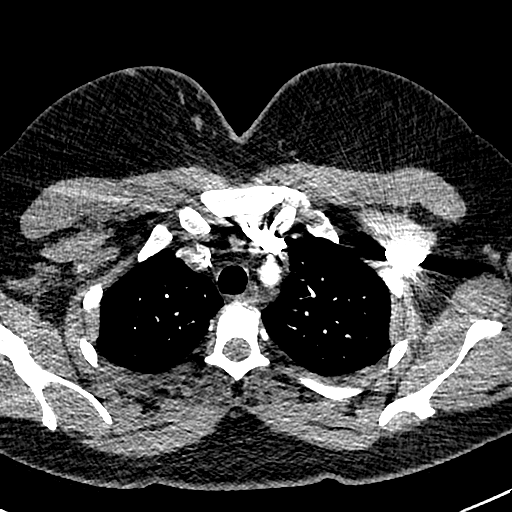
[im 281/325  lung]
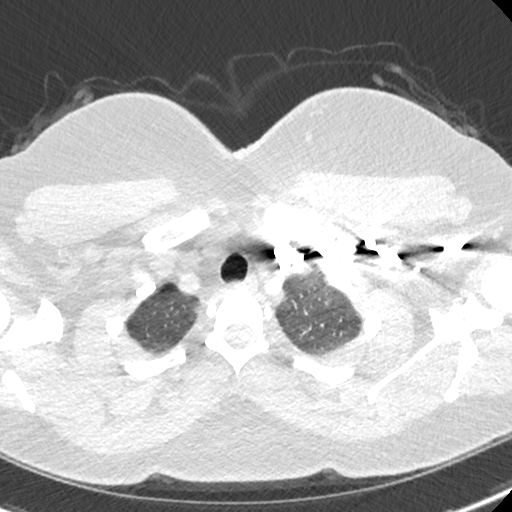
[im 303/325  mediastinal]
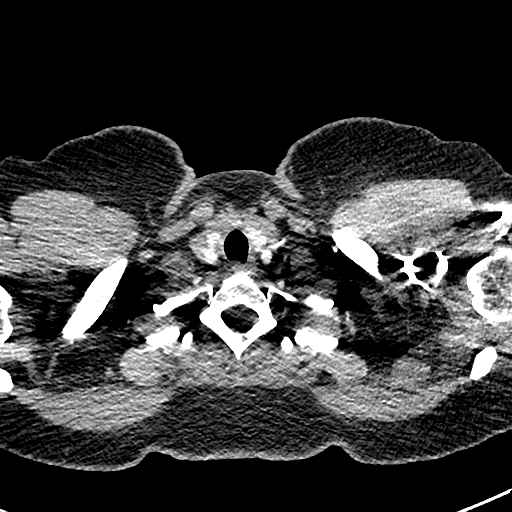

[Series 8: cta pulmonary 2.00 cor · coronal · 0.51mm/px · 1 of 146 slices shown]
[im 73/146  mediastinal]
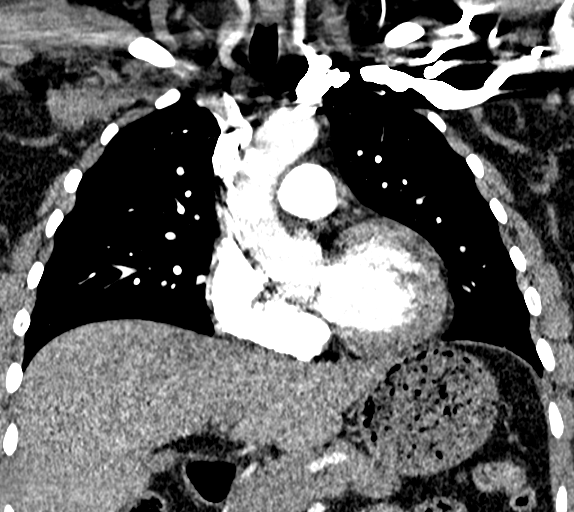

[15 of 36 positions shown; findings below may reference images not displayed]

FINDINGS: Cardiovascular: There is no demonstrable pulmonary embolus. There is
no thoracic aortic aneurysm or dissection. Visualized great vessels
appear unremarkable. No appreciable pericardial effusion or
pericardial thickening.

Mediastinum/Nodes: Visualized thyroid appears unremarkable. No
evident thoracic adenopathy. No esophageal lesions are appreciable.

Lungs/Pleura: Lungs are clear. No pleural effusions evident. No
pneumothorax. Trachea and major bronchial structures appear patent.

Upper Abdomen: Visualized upper abdominal structures appear
unremarkable.

Musculoskeletal: No blastic or lytic bone lesions. No evident chest
wall lesions.

Review of the MIP images confirms the above findings.
IMPRESSION: 1. No evident pulmonary embolus. No thoracic aortic aneurysm or
dissection.

2.  Lungs clear.

3.  No thoracic adenopathy.

## 2020-09-28 MED ORDER — IOHEXOL 350 MG/ML SOLN
75.0000 mL | Freq: Once | INTRAVENOUS | Status: AC | PRN
  Administered 2020-09-28: 75 mL via INTRAVENOUS

## 2020-09-28 NOTE — Assessment & Plan Note (Addendum)
HR 72. No chest pain, sob. Patient well appearing. We discussed positive family h/o aortic dissection and nonspecific complaint of electric sensation from chest to brow, occasionally leg. Unable to discern if all connected. EKG shows NSR with nonspecific t wave changes. No prior EKG. Cardiology appointment scheduled with Dr Sandie Ano 4/14 and advised patient if any new or worsening symptoms to report to ED. Pending labs, CT angio w/ and w/o contrast to rule out aortic dissection.

## 2020-09-28 NOTE — Telephone Encounter (Signed)
Patient's PCP is calling states patient is having "tingling" feeling in chest which radiates to head. Please review EKG and call with urgency of appointment

## 2020-09-28 NOTE — Telephone Encounter (Signed)
I called pt No aortic dissection, no pe  She is very reassured and will call us with any concerns

## 2020-09-28 NOTE — Telephone Encounter (Signed)
Fax sent over of current EKG, pt experiencing "tingling in chest, radiating to head" PCP wanted cardiologist to review to determine urgency for needed appt.  Dr. Mariah Milling ([facility]) reviewed EKG, HR 64, no abnormity noted, NSR. Advised to follow-up as needed with next available appt. For new patient. If PCP concern, recommends labs at PCP office, probably not cardiac related given age and symptoms.

## 2020-09-28 NOTE — Progress Notes (Signed)
Subjective:    Patient ID: Cassandra Greene, female    DOB: 1975/09/14, 45 y.o.   MRN: [id number]  CC: [name] is a 45 y.o. female who presents today for an acute visit.    HPI:  She will an electric sensation for a second in epigastric area which radiates to front of face, lips, forehead or even distal leg. Symptom doesn't occur when she is laying down. If she steps with right leg , she will feel electric sensation.  Pain doesn't radiate to the back. Episodes are intermittent and she is here today and symptom has improved 6 months ago, and then 2 weeks ago, symptom recurred.  No stress at work is very little.    She feels she has had lasting effects from covid. She feels 'palpitation, and pound' in epigastric area. Palpitations are more frequent.   She can have the palpitations without the electric sensation.  Started 2 years ago after covid infection. No caffeine, decongestants.   No associated cp, sob, leg swelling, numbness, HA, vision changes, fever, flushing, lightheadedness, syncope.  She is not lifting anything heavy. She will exercise with [employer] for aerobic training 1-3 times per week.   Negative home covid test 2 weeks ago Covid January 2020  Compliant with effexor 75mg  taking every third day and tapering off due to elevation in BP.She stop the effexor 2 days.  She had been on prozac in the past   Compliant with prevacid. No belching, abdominal pain.    Aunt passed away from ruptured aorta at 67. Maternal cousin died in 75's for ruptured aorta   She has seen PCP 06/2020 regarding stress. She had follow up with dr 07/2020 last month and anxiety, depression improved on effexor.   HISTORY:  Past Medical History:  Diagnosis Date  . Allergy   . Asthma   . Fatigue   . GERD (gastroesophageal reflux disease)   . Heavy periods   . Hyperlipidemia   . Painful menstrual periods   . Vitamin D deficiency   . Yeast infection of the vagina    No past surgical history  on file. Family History  Problem Relation Age of Onset  . Thyroid disease Mother   . Hypertension Mother   . Cancer Mother   . Hyperlipidemia Mother   . Thyroid disease Father   . Hypertension Father   . Cancer Father   . Hyperlipidemia Father   . Hearing loss Father   . Thyroid disease Maternal Aunt   . Thyroid disease Maternal Grandmother   . Hypertension Maternal Grandmother   . Heart disease Maternal Grandmother   . Thyroid disease Paternal Grandmother   . Hypertension Brother   . Kidney disease Brother   . Alcohol abuse Maternal Grandfather   . Stroke Maternal Grandfather   . Heart disease Maternal Grandfather   . Hearing loss Paternal Grandfather   . Hypertension Paternal Grandfather   . Hyperlipidemia Paternal Grandfather   . Breast cancer Neg Hx     Allergies: Patient has no known allergies. Current Outpatient Medications on File Prior to Visit  Medication Sig Dispense Refill  . ibuprofen (ADVIL,MOTRIN) 200 MG tablet Take 200 mg by mouth every 6 (six) hours as needed.    Lorin Picket levonorgestrel (MIRENA) 20 MCG/24HR IUD 1 each by Intrauterine route once.    . Vitamin D, Ergocalciferol, (DRISDOL) 1.25 MG (50000 UNIT) CAPS capsule TAKE 1 CAPSULE BY MOUTH TWICE PER WEEK 30 capsule 2   No current facility-administered medications  on file prior to visit.    Social History   Tobacco Use  . Smoking status: Never Smoker  . Smokeless tobacco: Never Used  Vaping Use  . Vaping Use: Never used  Substance Use Topics  . Alcohol use: Yes  . Drug use: No    Review of Systems  Constitutional: Negative for chills and fever.  Respiratory: Negative for cough, shortness of breath and wheezing.   Cardiovascular: Positive for chest pain (burning, electric). Negative for palpitations.  Gastrointestinal: Negative for abdominal pain, nausea and vomiting.  Genitourinary: Negative for dysuria.  Musculoskeletal: Negative for back pain.  Neurological: Negative for dizziness and headaches.       Objective:    BP (!) 140/92   Pulse 72   Temp 97.9 F (36.6 C)   Ht 5\' 2"  (1.575 m)   Wt 169 lb 12.8 oz (77 kg)   SpO2 99%   BMI 31.06 kg/m   BP Readings from Last 3 Encounters:  09/28/20 (!) 140/92  04/03/20 108/74  10/02/19 126/74    Physical Exam Vitals reviewed.  Constitutional:      Appearance: Normal appearance. She is well-developed.  HENT:     Mouth/Throat:     Pharynx: Uvula midline.  Eyes:     Conjunctiva/sclera: Conjunctivae normal.     Pupils: Pupils are equal, round, and reactive to light.     Comments: Fundus normal bilaterally.   Cardiovascular:     Rate and Rhythm: Normal rate and regular rhythm.     Pulses: Normal pulses.     Heart sounds: Normal heart sounds.     Comments: Palpable radial pulses.  Pulmonary:     Effort: Pulmonary effort is normal.     Breath sounds: Normal breath sounds. No wheezing, rhonchi or rales.  Chest:     Chest wall: No tenderness.  Abdominal:     General: Bowel sounds are normal. There is no distension.     Palpations: Abdomen is soft. Abdomen is not rigid. There is no fluid wave or mass.     Tenderness: There is no abdominal tenderness. There is no guarding or rebound.  Skin:    General: Skin is warm and dry.  Neurological:     Mental Status: She is alert.     Cranial Nerves: No cranial nerve deficit.     Sensory: No sensory deficit.     Deep Tendon Reflexes:     Reflex Scores:      Bicep reflexes are 2+ on the right side and 2+ on the left side.      Patellar reflexes are 2+ on the right side and 2+ on the left side.    Comments: Grip equal and strong bilateral upper extremities. Gait strong and steady. Able to perform rapid alternating movement without difficulty.   Psychiatric:        Speech: Speech normal.        Behavior: Behavior normal.        Thought Content: Thought content normal.        Assessment & Plan:   Problem List Items Addressed This Visit      Other   Palpitations - Primary     HR 72. No chest pain, sob. Patient well appearing. We discussed positive family h/o aortic dissection and nonspecific complaint of electric sensation from chest to brow, occasionally leg. Unable to discern if all connected. EKG shows NSR with nonspecific t wave changes. No prior EKG. Cardiology appointment scheduled with Dr 12/02/19 4/14  and advised patient if any new or worsening symptoms to report to ED. Pending labs, CT angio w/ and w/o contrast to rule out aortic dissection.       Relevant Orders   EKG 12-Lead (Completed)   Ambulatory referral to Cardiology   CBC with Differential/Platelet   Comprehensive metabolic panel   TSH   Magnesium   CT Angio Chest W/Cm &/Or Wo Cm        I have discontinued Trenee L. Hansell's venlafaxine XR. I am also having her maintain her levonorgestrel, ibuprofen, and Vitamin D (Ergocalciferol).   No orders of the defined types were placed in this encounter.   Return precautions given.   Risks, benefits, and alternatives of the medications and treatment plan prescribed today were discussed, and patient expressed understanding.   Education regarding symptom management and diagnosis given to patient on AVS.  Continue to follow with Dale [place], MD for routine health maintenance.   Cassandra Greene and I agreed with plan.   Rennie Plowman, FNP

## 2020-09-28 NOTE — Telephone Encounter (Signed)
Noted & PCP as well as Claris Che aware.

## 2020-09-28 NOTE — Telephone Encounter (Signed)
Pt confirmed she has been having these episodes of tingling feelings since having COVID 06/2018. She is doing okay but the episodes have became more frequent and she is questioning need for halter monitor. She has not had an EKG done. I scheduled her with you today at 11:30 to do a baseline EKG and discuss possibly seeing cardiology. No chest pain, SOB, etc. She has been screened and scheduled for 11:30 today.

## 2020-09-28 NOTE — Telephone Encounter (Signed)
Martie Lee from [facility] called, [phone]. She will call patient to schedule her for next available about per Dr. Mariah Milling, not an emergency.

## 2020-09-28 NOTE — Patient Instructions (Addendum)
Stop effexor on Wednesday.   Appointment with cardiology as scheduled 4/14, dr Sandie Ano   In the meantime, ANY worsening of palpitations or electric sensation , or sustained palpitations development of chest pain, please go directly to emergency room   Palpitations Palpitations are feelings that your heartbeat is irregular or is faster than normal. It may feel like your heart is fluttering or skipping a beat. Palpitations are usually not a serious problem. They may be caused by many things, including smoking, caffeine, alcohol, stress, and certain medicines or drugs. Most causes of palpitations are not serious. However, some palpitations can be a sign of a serious problem. You may need further tests to rule out serious medical problems. Follow these instructions at home: Pay attention to any changes in your condition. Take these actions to help manage your symptoms: Eating and drinking  Avoid foods and drinks that may cause palpitations. These may include: ? Caffeinated coffee, tea, soft drinks, diet pills, and energy drinks. ? Chocolate. ? Alcohol. Lifestyle  Take steps to reduce your stress and anxiety. Things that can help you relax include: ? Yoga. ? Mind-body activities, such as deep breathing, meditation, or using words and images to create positive thoughts (guided imagery). ? Physical activity, such as swimming, jogging, or walking. Tell your health care provider if your palpitations increase with activity. If you have chest pain or shortness of breath with activity, do not continue the activity until you are seen by your health care provider. ? Biofeedback. This is a method that helps you learn to use your mind to control things in your body, such as your heartbeat.  Do not use drugs, including cocaine or ecstasy. Do not use marijuana.  Get plenty of rest and sleep. Keep a regular bed time. General instructions  Take over-the-counter and prescription medicines only as told by  your health care provider.  Do not use any products that contain nicotine or tobacco, such as cigarettes and e-cigarettes. If you need help quitting, ask your health care provider.  Keep all follow-up visits as told by your health care provider. This is important. These may include visits for further testing if palpitations do not go away or get worse.      Contact a health care provider if you:  Continue to have a fast or irregular heartbeat after 24 hours.  Notice that your palpitations occur more often. Get help right away if you:  Have chest pain or shortness of breath.  Have a severe headache.  Feel dizzy or you faint. Summary  Palpitations are feelings that your heartbeat is irregular or is faster than normal. It may feel like your heart is fluttering or skipping a beat.  Palpitations may be caused by many things, including smoking, caffeine, alcohol, stress, certain medicines, and drugs.  Although most causes of palpitations are not serious, some causes can be a sign of a serious medical problem.  Get help right away if you faint or have chest pain, shortness of breath, a severe headache, or dizziness. This information is not intended to replace advice given to you by your health care provider. Make sure you discuss any questions you have with your health care provider. Document Revised: 07/26/2017 Document Reviewed: 07/26/2017 Elsevier Patient Education  2021 [employer].

## 2020-10-02 ENCOUNTER — Other Ambulatory Visit: Payer: Self-pay

## 2020-10-02 ENCOUNTER — Encounter: Payer: Self-pay | Admitting: Cardiology

## 2020-10-02 ENCOUNTER — Ambulatory Visit (INDEPENDENT_AMBULATORY_CARE_PROVIDER_SITE_OTHER): Payer: BC Managed Care – PPO

## 2020-10-02 ENCOUNTER — Ambulatory Visit (INDEPENDENT_AMBULATORY_CARE_PROVIDER_SITE_OTHER): Payer: BC Managed Care – PPO | Admitting: Cardiology

## 2020-10-02 VITALS — BP 112/82 | HR 78 | Ht 61.5 in | Wt 170.0 lb

## 2020-10-02 DIAGNOSIS — E7800 Pure hypercholesterolemia, unspecified: Secondary | ICD-10-CM

## 2020-10-02 DIAGNOSIS — R002 Palpitations: Secondary | ICD-10-CM | POA: Diagnosis not present

## 2020-10-02 NOTE — Progress Notes (Signed)
Cardiology Office Note:    Date:  10/02/2020   ID:  Cassandra Greene, DOB 1975/09/18, MRN [id number]  PCP:  Dale [place], MD   [facility]  Cardiologist:  No primary care provider on file.  Advanced Practice Provider:  No care team member to display Electrophysiologist:  None       Referring MD: Allegra Grana, FNP   Chief Complaint  Patient presents with  . New Patient (Initial Visit)    Referred by PCP for palpitations. Meds reviewed verbally with patient.    Cassandra Greene is a 45 y.o. female who is being seen today for the evaluation of agitation at the request of Jason Coop Lyn Records, FNP.   History of Present Illness:    Cassandra Greene is a 45 y.o. female with a hx of hyperlipidemia who presents due to palpitations.  She states having symptoms of increased heartbeats ongoing for over a year and a half.  Initially started after she developed COVID-19 in January 2021.  Symptoms did get better, seem to have recurred and noncardiac more frequently over the past several weeks.  States palpitations/increased heart rates alcohol at least 6 out of 7 days weekly.  Symptoms typically last couple of minutes and associated with tingling/numbness in her lower lip.  Has occasionally slight dizziness.  She denies consuming caffeinated products, energy drinks.  Denies any history of heart disease.  Past Medical History:  Diagnosis Date  . Allergy   . Asthma   . Fatigue   . GERD (gastroesophageal reflux disease)   . Heavy periods   . Hyperlipidemia   . Painful menstrual periods   . Vitamin D deficiency   . Yeast infection of the vagina     History reviewed. No pertinent surgical history.  Current Medications: Current Meds  Medication Sig  . ibuprofen (ADVIL,MOTRIN) 200 MG tablet Take 200 mg by mouth every 6 (six) hours as needed.  Marland Kitchen levonorgestrel (MIRENA) 20 MCG/24HR IUD 1 each by Intrauterine route once.  . Vitamin D, Ergocalciferol, (DRISDOL)  1.25 MG (50000 UNIT) CAPS capsule TAKE 1 CAPSULE BY MOUTH TWICE PER WEEK     Allergies:   Patient has no known allergies.   Social History   Socioeconomic History  . Marital status: Married    Spouse name: Not on file  . Number of children: 3  . Years of education: Not on file  . Highest education level: Bachelor's degree (e.g., BA, AB, BS)  Occupational History  . Not on file  Tobacco Use  . Smoking status: Never Smoker  . Smokeless tobacco: Never Used  Vaping Use  . Vaping Use: Never used  Substance and Sexual Activity  . Alcohol use: Yes  . Drug use: No  . Sexual activity: Yes    Birth control/protection: I.U.D.    Comment: mirena  Other Topics Concern  . Not on file  Social History Narrative  . Not on file   Social Determinants of Health   Financial Resource Strain: Not on file  Food Insecurity: Not on file  Transportation Needs: Not on file  Physical Activity: Not on file  Stress: Not on file  Social Connections: Not on file     Family History: The patient's family history includes Alcohol abuse in her maternal grandfather; Cancer in her father and mother; Hearing loss in her father and paternal grandfather; Heart disease in her maternal grandfather and maternal grandmother; Hyperlipidemia in her father, mother, and paternal grandfather; Hypertension in  her brother, father, maternal grandmother, mother, and paternal grandfather; Kidney disease in her brother; Stroke in her maternal grandfather; Thyroid disease in her father, maternal aunt, maternal grandmother, mother, and paternal grandmother. There is no history of Breast cancer.  ROS:   Please see the history of present illness.     All other systems reviewed and are negative.  EKGs/Labs/Other Studies Reviewed:    The following studies were reviewed today:   EKG:  EKG is  ordered today.  The ekg ordered today demonstrates normal sinus rhythm, normal ECG  Recent Labs: 09/28/2020: ALT 20; BUN 21;  Creatinine, Ser 0.71; Hemoglobin 14.5; Magnesium 2.0; Platelets 263.0; Potassium 4.1; Sodium 137; TSH 2.76  Recent Lipid Panel    Component Value Date/Time   CHOL 227 (H) 10/02/2019 1141   CHOL 231 (H) 06/05/2015 1008   TRIG 158.0 (H) 10/02/2019 1141   TRIG 109 07/07/2016 0936   HDL 48.30 10/02/2019 1141   HDL 48 07/07/2016 0936   CHOLHDL 5 10/02/2019 1141   VLDL 31.6 10/02/2019 1141   LDLCALC 148 (H) 10/02/2019 1141   LDLCALC 147 (H) 06/05/2015 1008     Risk Assessment/Calculations:      Physical Exam:    VS:  BP 112/82 (BP Location: Left Arm, Patient Position: Sitting, Cuff Size: Normal)   Pulse 78   Ht 5' 1.5" (1.562 m)   Wt 170 lb (77.1 kg)   SpO2 97%   BMI 31.60 kg/m     Wt Readings from Last 3 Encounters:  10/02/20 170 lb (77.1 kg)  09/28/20 169 lb 12.8 oz (77 kg)  09/02/20 163 lb (73.9 kg)     GEN:  Well nourished, well developed in no acute distress HEENT: Normal NECK: No JVD; No carotid bruits LYMPHATICS: No lymphadenopathy CARDIAC: RRR, no murmurs, rubs, gallops RESPIRATORY:  Clear to auscultation without rales, wheezing or rhonchi  ABDOMEN: Soft, non-tender, non-distended MUSCULOSKELETAL:  No edema; No deformity  SKIN: Warm and dry NEUROLOGIC:  Alert and oriented x 3 PSYCHIATRIC:  Normal affect   ASSESSMENT:    1. Palpitations   2. Pure hypercholesterolemia    PLAN:    In order of problems listed above:  1. Palpitations, associated with dizziness, tingling of lower lip.  Place cardiac monitor to evaluate any significant arrhythmias, patient will wear for 1 week as she plans on going on vacation soon to [place] which will involve swimming.   2. Hyperlipidemia, 10-year ASCVD risk 0.9%.  Patient not in statin benefit group.  Low-cholesterol diet advised.  Follow-up after cardiac monitor.     Medication Adjustments/Labs and Tests Ordered: Current medicines are reviewed at length with the patient today.  Concerns regarding medicines are outlined  above.  Orders Placed This Encounter  Procedures  . LONG TERM MONITOR (3-14 DAYS)  . EKG 12-Lead   No orders of the defined types were placed in this encounter.   Patient Instructions  Medication Instructions:  Your physician recommends that you continue on your current medications as directed. Please refer to the Current Medication list given to you today.  *If you need a refill on your cardiac medications before your next appointment, please call your pharmacy*   Lab Work: None ordered If you have labs (blood work) drawn today and your tests are completely normal, you will receive your results only by: Marland Kitchen MyChart Message (if you have MyChart) OR . A paper copy in the mail If you have any lab test that is abnormal or we need to change  your treatment, we will call you to review the results.   Testing/Procedures:  Your physician has recommended that you wear a Zio monitor for 8 days (or until you leave for vacation). This monitor is a medical device that records the heart's electrical activity. Doctors most often use these monitors to diagnose arrhythmias. Arrhythmias are problems with the speed or rhythm of the heartbeat. The monitor is a small device applied to your chest. You can wear one while you do your normal daily activities. While wearing this monitor if you have any symptoms to push the button and record what you felt. Once you have worn this monitor for the period of time provider prescribed (Usually 14 days), you will return the monitor device in the postage paid box. Once it is returned they will download the data collected and provide [place] with a report which the provider will then review and we will call you with those results. Important tips:  1. Avoid showering during the first 24 hours of wearing the monitor. 2. Avoid excessive sweating to help maximize wear time. 3. Do not submerge the device, no hot tubs, and no swimming pools. 4. Keep any lotions or oils away from the  patch. 5. After 24 hours you may shower with the patch on. Take brief showers with your back facing the shower head.  6. Do not remove patch once it has been placed because that will interrupt data and decrease adhesive wear time. 7. Push the button when you have any symptoms and write down what you were feeling. 8. Once you have completed wearing your monitor, remove and place into box which has postage paid and place in your outgoing mailbox.  9. If for some reason you have misplaced your box then call our office and we can provide another box and/or mail it off for you.         Follow-Up: At [facility], you and your health needs are our priority.  As part of our continuing mission to provide you with exceptional heart care, we have created designated Provider Care Teams.  These Care Teams include your primary Cardiologist (physician) and Advanced Practice Providers (APPs -  Physician Assistants and Nurse Practitioners) who all work together to provide you with the care you need, when you need it.  We recommend signing up for the patient portal called "MyChart".  Sign up information is provided on this After Visit Summary.  MyChart is used to connect with patients for Virtual Visits (Telemedicine).  Patients are able to view lab/test results, encounter notes, upcoming appointments, etc.  Non-urgent messages can be sent to your provider as well.   To learn more about what you can do with MyChart, go to [email or web address].    Your next appointment:   5 month(s)  The format for your next appointment:   In Person  Provider:   Debbe Odea, MD   Other Instructions      Signed, Debbe Odea, MD  10/02/2020 12:30 PM    [facility]

## 2020-10-02 NOTE — Patient Instructions (Signed)
Medication Instructions:  Your physician recommends that you continue on your current medications as directed. Please refer to the Current Medication list given to you today.  *If you need a refill on your cardiac medications before your next appointment, please call your pharmacy*   Lab Work: None ordered If you have labs (blood work) drawn today and your tests are completely normal, you will receive your results only by: Marland Kitchen MyChart Message (if you have MyChart) OR . A paper copy in the mail If you have any lab test that is abnormal or we need to change your treatment, we will call you to review the results.   Testing/Procedures:  Your physician has recommended that you wear a Zio monitor for 8 days (or until you leave for vacation). This monitor is a medical device that records the heart's electrical activity. Doctors most often use these monitors to diagnose arrhythmias. Arrhythmias are problems with the speed or rhythm of the heartbeat. The monitor is a small device applied to your chest. You can wear one while you do your normal daily activities. While wearing this monitor if you have any symptoms to push the button and record what you felt. Once you have worn this monitor for the period of time provider prescribed (Usually 14 days), you will return the monitor device in the postage paid box. Once it is returned they will download the data collected and provide [place] with a report which the provider will then review and we will call you with those results. Important tips:  1. Avoid showering during the first 24 hours of wearing the monitor. 2. Avoid excessive sweating to help maximize wear time. 3. Do not submerge the device, no hot tubs, and no swimming pools. 4. Keep any lotions or oils away from the patch. 5. After 24 hours you may shower with the patch on. Take brief showers with your back facing the shower head.  6. Do not remove patch once it has been placed because that will interrupt  data and decrease adhesive wear time. 7. Push the button when you have any symptoms and write down what you were feeling. 8. Once you have completed wearing your monitor, remove and place into box which has postage paid and place in your outgoing mailbox.  9. If for some reason you have misplaced your box then call our office and we can provide another box and/or mail it off for you.         Follow-Up: At [facility], you and your health needs are our priority.  As part of our continuing mission to provide you with exceptional heart care, we have created designated Provider Care Teams.  These Care Teams include your primary Cardiologist (physician) and Advanced Practice Providers (APPs -  Physician Assistants and Nurse Practitioners) who all work together to provide you with the care you need, when you need it.  We recommend signing up for the patient portal called "MyChart".  Sign up information is provided on this After Visit Summary.  MyChart is used to connect with patients for Virtual Visits (Telemedicine).  Patients are able to view lab/test results, encounter notes, upcoming appointments, etc.  Non-urgent messages can be sent to your provider as well.   To learn more about what you can do with MyChart, go to [email or web address].    Your next appointment:   5 month(s)  The format for your next appointment:   In Person  Provider:   Debbe Odea, MD   Other Instructions

## 2020-10-08 ENCOUNTER — Ambulatory Visit: Payer: BC Managed Care – PPO | Admitting: Cardiology

## 2020-10-20 ENCOUNTER — Telehealth: Payer: Self-pay | Admitting: Internal Medicine

## 2020-10-20 NOTE — Telephone Encounter (Signed)
She said that her BP has been running high since she is having back pain

## 2020-10-20 NOTE — Telephone Encounter (Signed)
Called patient and left message letting her know that she does need to keep appt tomorrow and we can discuss back pain and BP. Advised to call back if needed.

## 2020-10-20 NOTE — Telephone Encounter (Signed)
Pt called and wanted to know if she needed to keep her appt for tomorrow  Please call pt back

## 2020-10-21 ENCOUNTER — Telehealth: Payer: Self-pay | Admitting: Internal Medicine

## 2020-10-21 ENCOUNTER — Encounter: Payer: Self-pay | Admitting: Internal Medicine

## 2020-10-21 ENCOUNTER — Ambulatory Visit: Payer: BC Managed Care – PPO | Admitting: Internal Medicine

## 2020-10-21 ENCOUNTER — Other Ambulatory Visit: Payer: Self-pay

## 2020-10-21 DIAGNOSIS — F439 Reaction to severe stress, unspecified: Secondary | ICD-10-CM | POA: Diagnosis not present

## 2020-10-21 DIAGNOSIS — R002 Palpitations: Secondary | ICD-10-CM

## 2020-10-21 DIAGNOSIS — R030 Elevated blood-pressure reading, without diagnosis of hypertension: Secondary | ICD-10-CM

## 2020-10-21 NOTE — Progress Notes (Signed)
Patient ID: Cassandra Greene, female   DOB: 14-Nov-1975, 45 y.o.   MRN: [id number]   Subjective:    Patient ID: Cassandra Greene, female    DOB: 02/02/1976, 45 y.o.   MRN: [id number]  HPI This visit occurred during the SARS-CoV-2 public health emergency.  Safety protocols were in place, including screening questions prior to the visit, additional usage of staff PPE, and extensive cleaning of exam room while observing appropriate contact time as indicated for disinfecting solutions.  Patient here for follow up appt.  Here to follow up regarding her blood pressure and f/u regarding recent palpitations.  Saw cardiology 10/02/20 - evaluation of palpitations.  Had cardiac monitor placed.  Waiting for results.  Notices occasional palpitations - brief episodes.  No syncope or near syncope.  No chest pain.  Breathing stable.  Stress appears to be better.  Eating.  No nausea or vomiting.  Bowels moving.  Is concerned that she has gained back some of her weight.  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  Trying to stay active.  Has had some discomfort - back - infected cyst.  S/p I&D - dermatology - yesterday.  Has been taking ibuprofen.  Pain better now.  Blood pressure has been elevated  Reviewed outside readings:  120s-140/90s.  Off effexor x 2 weeks.  Taking prevacid - controlling acid reflux.    Past Medical History:  Diagnosis Date  . Allergy   . Asthma   . Fatigue   . GERD (gastroesophageal reflux disease)   . Heavy periods   . Hyperlipidemia   . Painful menstrual periods   . Vitamin D deficiency   . Yeast infection of the vagina    History reviewed. No pertinent surgical history. Family History  Problem Relation Age of Onset  . Thyroid disease Mother   . Hypertension Mother   . Cancer Mother   . Hyperlipidemia Mother   . Thyroid disease Father   . Hypertension Father   . Cancer Father   . Hyperlipidemia Father   . Hearing loss Father   . Thyroid disease Maternal Aunt   . Thyroid disease  Maternal Grandmother   . Hypertension Maternal Grandmother   . Heart disease Maternal Grandmother   . Thyroid disease Paternal Grandmother   . Hypertension Brother   . Kidney disease Brother   . Alcohol abuse Maternal Grandfather   . Stroke Maternal Grandfather   . Heart disease Maternal Grandfather   . Hearing loss Paternal Grandfather   . Hypertension Paternal Grandfather   . Hyperlipidemia Paternal Grandfather   . Breast cancer Neg Hx    Social History   Socioeconomic History  . Marital status: Married    Spouse name: Not on file  . Number of children: 3  . Years of education: Not on file  . Highest education level: Bachelor's degree (e.g., BA, AB, BS)  Occupational History  . Not on file  Tobacco Use  . Smoking status: Never Smoker  . Smokeless tobacco: Never Used  Vaping Use  . Vaping Use: Never used  Substance and Sexual Activity  . Alcohol use: Yes  . Drug use: No  . Sexual activity: Yes    Birth control/protection: I.U.D.    Comment: mirena  Other Topics Concern  . Not on file  Social History Narrative  . Not on file   Social Determinants of Health   Financial Resource Strain: Not on file  Food Insecurity: Not on file  Transportation Needs: Not on  file  Physical Activity: Not on file  Stress: Not on file  Social Connections: Not on file    Outpatient Encounter Medications as of 10/21/2020  Medication Sig  . ibuprofen (ADVIL,MOTRIN) 200 MG tablet Take 200 mg by mouth every 6 (six) hours as needed.  Marland Kitchen levonorgestrel (MIRENA) 20 MCG/24HR IUD 1 each by Intrauterine route once.  . Vitamin D, Ergocalciferol, (DRISDOL) 1.25 MG (50000 UNIT) CAPS capsule TAKE 1 CAPSULE BY MOUTH TWICE PER WEEK   No facility-administered encounter medications on file as of 10/21/2020.    Review of Systems  Constitutional: Negative for appetite change and unexpected weight change.  HENT: Negative for congestion and sinus pressure.   Respiratory: Negative for cough, chest  tightness and shortness of breath.   Cardiovascular: Positive for palpitations. Negative for chest pain and leg swelling.  Gastrointestinal: Negative for abdominal pain, diarrhea, nausea and vomiting.  Genitourinary: Negative for difficulty urinating and dysuria.  Musculoskeletal: Negative for joint swelling and myalgias.  Skin: Negative for color change and rash.  Neurological: Negative for dizziness, light-headedness and headaches.  Psychiatric/Behavioral: Negative for agitation and dysphoric mood.       Objective:    Physical Exam Vitals reviewed.  Constitutional:      General: She is not in acute distress.    Appearance: Normal appearance.  HENT:     Head: Normocephalic and atraumatic.     Right Ear: External ear normal.     Left Ear: External ear normal.  Eyes:     General: No scleral icterus.       Right eye: No discharge.        Left eye: No discharge.     Conjunctiva/sclera: Conjunctivae normal.  Neck:     Thyroid: No thyromegaly.  Cardiovascular:     Rate and Rhythm: Normal rate and regular rhythm.  Pulmonary:     Effort: No respiratory distress.     Breath sounds: Normal breath sounds. No wheezing.  Abdominal:     General: Bowel sounds are normal.     Palpations: Abdomen is soft.     Tenderness: There is no abdominal tenderness.  Musculoskeletal:        General: No swelling or tenderness.     Cervical back: Neck supple. No tenderness.  Lymphadenopathy:     Cervical: No cervical adenopathy.  Skin:    Findings: No erythema or rash.  Neurological:     Mental Status: She is alert.  Psychiatric:        Mood and Affect: Mood normal.        Behavior: Behavior normal.     BP 124/80 (BP Location: Left Arm, Patient Position: Sitting)   Pulse 80   Temp 98.1 F (36.7 C)   Ht 5' 1.5" (1.562 m)   Wt 173 lb 12.8 oz (78.8 kg)   SpO2 98%   BMI 32.31 kg/m  Wt Readings from Last 3 Encounters:  10/21/20 173 lb 12.8 oz (78.8 kg)  10/02/20 170 lb (77.1 kg)   09/28/20 169 lb 12.8 oz (77 kg)     Lab Results  Component Value Date   WBC 6.4 09/28/2020   HGB 14.5 09/28/2020   HCT 42.9 09/28/2020   PLT 263.0 09/28/2020   GLUCOSE 80 09/28/2020   CHOL 227 (H) 10/02/2019   TRIG 158.0 (H) 10/02/2019   HDL 48.30 10/02/2019   LDLCALC 148 (H) 10/02/2019   ALT 20 09/28/2020   AST 20 09/28/2020   NA 137 09/28/2020   K 4.1  09/28/2020   CL 104 09/28/2020   CREATININE 0.71 09/28/2020   BUN 21 09/28/2020   CO2 25 09/28/2020   TSH 2.76 09/28/2020   HGBA1C 5.2 08/30/2018    CT Angio Chest W/Cm &/Or Wo Cm  Result Date: 09/28/2020 CLINICAL DATA:  Cardiac palpitations EXAM: CT ANGIOGRAPHY CHEST WITH CONTRAST TECHNIQUE: Multidetector CT imaging of the chest was performed using the standard protocol during bolus administration of intravenous contrast. Multiplanar CT image reconstructions and MIPs were obtained to evaluate the vascular anatomy. CONTRAST:  30mL OMNIPAQUE IOHEXOL 350 MG/ML SOLN COMPARISON:  None. FINDINGS: Cardiovascular: There is no demonstrable pulmonary embolus. There is no thoracic aortic aneurysm or dissection. Visualized great vessels appear unremarkable. No appreciable pericardial effusion or pericardial thickening. Mediastinum/Nodes: Visualized thyroid appears unremarkable. No evident thoracic adenopathy. No esophageal lesions are appreciable. Lungs/Pleura: Lungs are clear. No pleural effusions evident. No pneumothorax. Trachea and major bronchial structures appear patent. Upper Abdomen: Visualized upper abdominal structures appear unremarkable. Musculoskeletal: No blastic or lytic bone lesions. No evident chest wall lesions. Review of the MIP images confirms the above findings. IMPRESSION: 1. No evident pulmonary embolus. No thoracic aortic aneurysm or dissection. 2.  Lungs clear. 3.  No thoracic adenopathy. Electronically Signed   By: Bretta Bang III M.D.   On: 09/28/2020 15:53       Assessment & Plan:   Problem List Items  Addressed This Visit    Elevated blood pressure reading    Blood pressure elevated as outlined. Discussed treatment - starting low dose beta blocker.  Wants to monitor.  S/p I&D cyst.  Pain is better.  Plans to start exercising more and watching diet.  Hold on making changes in medication.  Follow pressures.  Send in readings.        Palpitations    Reports continued intermittent palpitations as outlined.  Waiting for cardiac monitor results.  Blood pressure elevated.  Discussed treatment.  Discussed starting low dose beta blocker.  She denies increased caffeine or decongestants.  Continue to avoid.  Stress appears to be better.  Wants to monitor blood pressure a little longer.  Hold on medication today.  Follow pressures.  Send in readings.  If persistent elevation, will require medication.       Stress    Overall stress is better.  On no medication.  Off effexor.  Follow.           Dale [place], MD

## 2020-10-21 NOTE — Telephone Encounter (Signed)
My chart message sent to [facility] for update.

## 2020-10-22 ENCOUNTER — Encounter: Payer: Self-pay | Admitting: Internal Medicine

## 2020-10-22 DIAGNOSIS — R030 Elevated blood-pressure reading, without diagnosis of hypertension: Secondary | ICD-10-CM | POA: Insufficient documentation

## 2020-10-22 DIAGNOSIS — I10 Essential (primary) hypertension: Secondary | ICD-10-CM | POA: Insufficient documentation

## 2020-10-22 NOTE — Assessment & Plan Note (Signed)
Reports continued intermittent palpitations as outlined.  Waiting for cardiac monitor results.  Blood pressure elevated.  Discussed treatment.  Discussed starting low dose beta blocker.  She denies increased caffeine or decongestants.  Continue to avoid.  Stress appears to be better.  Wants to monitor blood pressure a little longer.  Hold on medication today.  Follow pressures.  Send in readings.  If persistent elevation, will require medication.

## 2020-10-22 NOTE — Assessment & Plan Note (Signed)
Blood pressure elevated as outlined. Discussed treatment - starting low dose beta blocker.  Wants to monitor.  S/p I&D cyst.  Pain is better.  Plans to start exercising more and watching diet.  Hold on making changes in medication.  Follow pressures.  Send in readings.

## 2020-10-22 NOTE — Assessment & Plan Note (Signed)
Overall stress is better.  On no medication.  Off effexor.  Follow.

## 2020-11-05 ENCOUNTER — Ambulatory Visit: Payer: BC Managed Care – PPO | Admitting: Cardiology

## 2020-11-05 ENCOUNTER — Telehealth: Payer: Self-pay | Admitting: Internal Medicine

## 2020-11-05 MED ORDER — CARVEDILOL 3.125 MG PO TABS: 3.1250 mg | ORAL_TABLET | Freq: Two times a day (BID) | ORAL | 2 refills | Status: DC

## 2020-11-05 NOTE — Addendum Note (Signed)
Addended by: Charm Barges on: 11/05/2020 07:19 AM   Modules accepted: Orders

## 2020-11-05 NOTE — Telephone Encounter (Signed)
-----   Message from Debbe Odea, MD sent at 11/04/2020  5:51 PM EDT ----- Regarding: RE: question I think a low-dose beta-blocker is reasonable.  As it might also help improve her symptoms of palpitations.  Carvedilol/Coreg is usually better for BP control but dosed twice daily.  If patient has compliance issues, okay to start Toprol-XL 25 mg daily since it is once a day dosing.  Thank you BA ----- Message ----- From: Dale [place], MD Sent: 11/03/2020   8:45 PM EDT To: Debbe Odea, MD Subject: question                                       You saw Cassandra Greene recently for evaluation of palpitations.  Her monitor revealed no significant arrhythmias.  She is having persistent elevated blood pressure (130s-140s/90-100).  We discussed starting her on a low dose beta blocker.  Since you saw her, I wanted to get your input.  Thank you for seeing her.   Thank you  Dale [place]

## 2020-11-05 NOTE — Telephone Encounter (Signed)
rx sent in for carvedilol 3.125mg  bid #60 with 2 refills.  Pt notified via my chart.

## 2020-11-20 ENCOUNTER — Ambulatory Visit: Payer: BC Managed Care – PPO | Admitting: Cardiology

## 2020-11-20 MED ORDER — ALPRAZOLAM 0.25 MG PO TABS: 0.2500 mg | ORAL_TABLET | Freq: Every day | ORAL | 0 refills | Status: DC | PRN

## 2020-11-20 NOTE — Telephone Encounter (Signed)
I am ok to give her a few xanax to have if needed.  See if she is agreeable to schedule an appt to discuss weight loss medication and f/u on anxiety.

## 2020-11-20 NOTE — Telephone Encounter (Signed)
Patient scheduled for f/u on 11/26/20

## 2020-11-20 NOTE — Addendum Note (Signed)
Addended by: Charm Barges on: 11/20/2020 02:40 PM   Modules accepted: Orders

## 2020-11-20 NOTE — Telephone Encounter (Signed)
rx sent in for xanax.  #10 with no refills.  Scheduled appt.

## 2020-11-26 ENCOUNTER — Other Ambulatory Visit: Payer: Self-pay

## 2020-11-26 ENCOUNTER — Ambulatory Visit: Payer: BC Managed Care – PPO | Admitting: Internal Medicine

## 2020-11-26 DIAGNOSIS — I10 Essential (primary) hypertension: Secondary | ICD-10-CM | POA: Diagnosis not present

## 2020-11-26 DIAGNOSIS — R002 Palpitations: Secondary | ICD-10-CM | POA: Diagnosis not present

## 2020-11-26 DIAGNOSIS — F439 Reaction to severe stress, unspecified: Secondary | ICD-10-CM

## 2020-11-26 DIAGNOSIS — Z6831 Body mass index (BMI) 31.0-31.9, adult: Secondary | ICD-10-CM

## 2020-11-26 DIAGNOSIS — E7800 Pure hypercholesterolemia, unspecified: Secondary | ICD-10-CM

## 2020-11-26 NOTE — Progress Notes (Signed)
Patient ID: Cassandra Greene, female   DOB: 06/13/1976, 44 y.o.   MRN: [id number]   Subjective:    Patient ID: Cassandra Greene, female    DOB: 05-05-1976, 45 y.o.   MRN: [id number]  HPI This visit occurred during the SARS-CoV-2 public health emergency.  Safety protocols were in place, including screening questions prior to the visit, additional usage of staff PPE, and extensive cleaning of exam room while observing appropriate contact time as indicated for disinfecting solutions.  Patient here for work in appt.  Work in to discuss weight loss and to follow up regarding her blood pressure. She was recently placed on carvedilol.  Tolerating. Palpitations are better.  She feels better overall.  Work is some better.  School will be out soon.  Tries to stay active.  No chest pain.  Breathing stable.  No acid reflux reported.  No abdominal pain.  Bowels moving.  Had questions about weight loss.  Friend - on wegovy.  Request to find out more about this medication.  Discussed diet and exercise.     Past Medical History:  Diagnosis Date   Allergy    Asthma    Fatigue    GERD (gastroesophageal reflux disease)    Heavy periods    Hyperlipidemia    Painful menstrual periods    Vitamin D deficiency    Yeast infection of the vagina    History reviewed. No pertinent surgical history. Family History  Problem Relation Age of Onset   Thyroid disease Mother    Hypertension Mother    Cancer Mother    Hyperlipidemia Mother    Thyroid disease Father    Hypertension Father    Cancer Father    Hyperlipidemia Father    Hearing loss Father    Thyroid disease Maternal Aunt    Thyroid disease Maternal Grandmother    Hypertension Maternal Grandmother    Heart disease Maternal Grandmother    Thyroid disease Paternal Grandmother    Hypertension Brother    Kidney disease Brother    Alcohol abuse Maternal Grandfather    Stroke Maternal Grandfather    Heart disease Maternal Grandfather    Hearing loss  Paternal Grandfather    Hypertension Paternal Grandfather    Hyperlipidemia Paternal Grandfather    Breast cancer Neg Hx    Social History   Socioeconomic History   Marital status: Married    Spouse name: Not on file   Number of children: 3   Years of education: Not on file   Highest education level: Bachelor's degree (e.g., BA, AB, BS)  Occupational History   Not on file  Tobacco Use   Smoking status: Never   Smokeless tobacco: Never  Vaping Use   Vaping Use: Never used  Substance and Sexual Activity   Alcohol use: Yes   Drug use: No   Sexual activity: Yes    Birth control/protection: I.U.D.    Comment: mirena  Other Topics Concern   Not on file  Social History Narrative   Not on file   Social Determinants of Health   Financial Resource Strain: Not on file  Food Insecurity: Not on file  Transportation Needs: Not on file  Physical Activity: Not on file  Stress: Not on file  Social Connections: Not on file    Outpatient Encounter Medications as of 11/26/2020  Medication Sig   ALPRAZolam (XANAX) 0.25 MG tablet Take 1 tablet (0.25 mg total) by mouth daily as needed for anxiety.   carvedilol (COREG)  3.125 MG tablet Take 1 tablet (3.125 mg total) by mouth 2 (two) times daily with a meal.   ibuprofen (ADVIL,MOTRIN) 200 MG tablet Take 200 mg by mouth every 6 (six) hours as needed.   levonorgestrel (MIRENA) 20 MCG/24HR IUD 1 each by Intrauterine route once.   Vitamin D, Ergocalciferol, (DRISDOL) 1.25 MG (50000 UNIT) CAPS capsule TAKE 1 CAPSULE BY MOUTH TWICE PER WEEK   No facility-administered encounter medications on file as of 11/26/2020.    Review of Systems  Constitutional:  Negative for appetite change and unexpected weight change.  HENT:  Negative for congestion and sinus pressure.   Respiratory:  Negative for cough, chest tightness and shortness of breath.   Cardiovascular:  Negative for chest pain and leg swelling.       Palpitations better.    Gastrointestinal:   Negative for abdominal pain, diarrhea and nausea.  Genitourinary:  Negative for difficulty urinating and dysuria.  Musculoskeletal:  Negative for joint swelling and myalgias.  Skin:  Negative for color change and rash.  Neurological:  Negative for dizziness, light-headedness and headaches.  Psychiatric/Behavioral:  Negative for agitation and dysphoric mood.       Objective:    Physical Exam Constitutional:      General: She is not in acute distress.    Appearance: Normal appearance.  HENT:     Head: Normocephalic and atraumatic.     Right Ear: External ear normal.     Left Ear: External ear normal.     Nose: Nose normal.  Eyes:     General: No scleral icterus.       Right eye: No discharge.        Left eye: No discharge.     Conjunctiva/sclera: Conjunctivae normal.  Neck:     Thyroid: No thyromegaly.  Cardiovascular:     Rate and Rhythm: Normal rate and regular rhythm.  Pulmonary:     Effort: No respiratory distress.     Breath sounds: Normal breath sounds. No wheezing.  Abdominal:     General: Bowel sounds are normal.     Palpations: Abdomen is soft.     Tenderness: There is no abdominal tenderness.  Musculoskeletal:        General: No swelling or tenderness.     Cervical back: Neck supple. No tenderness.  Lymphadenopathy:     Cervical: No cervical adenopathy.  Skin:    Findings: No erythema or rash.  Neurological:     Mental Status: She is alert.  Psychiatric:        Mood and Affect: Mood normal.        Behavior: Behavior normal.    BP 122/80   Pulse 83   Temp 97.8 F (36.6 C)   Resp 16   Ht 5\' 1"  (1.549 m)   Wt 169 lb (76.7 kg)   SpO2 99%   BMI 31.93 kg/m  Wt Readings from Last 3 Encounters:  11/26/20 169 lb (76.7 kg)  10/21/20 173 lb 12.8 oz (78.8 kg)  10/02/20 170 lb (77.1 kg)     Lab Results  Component Value Date   WBC 6.4 09/28/2020   HGB 14.5 09/28/2020   HCT 42.9 09/28/2020   PLT 263.0 09/28/2020   GLUCOSE 80 09/28/2020   CHOL 227 (H)  10/02/2019   TRIG 158.0 (H) 10/02/2019   HDL 48.30 10/02/2019   LDLCALC 148 (H) 10/02/2019   ALT 20 09/28/2020   AST 20 09/28/2020   NA 137 09/28/2020   K 4.1  09/28/2020   CL 104 09/28/2020   CREATININE 0.71 09/28/2020   BUN 21 09/28/2020   CO2 25 09/28/2020   TSH 2.76 09/28/2020   HGBA1C 5.2 08/30/2018    CT Angio Chest W/Cm &/Or Wo Cm  Result Date: 09/28/2020 CLINICAL DATA:  Cardiac palpitations EXAM: CT ANGIOGRAPHY CHEST WITH CONTRAST TECHNIQUE: Multidetector CT imaging of the chest was performed using the standard protocol during bolus administration of intravenous contrast. Multiplanar CT image reconstructions and MIPs were obtained to evaluate the vascular anatomy. CONTRAST:  71mL OMNIPAQUE IOHEXOL 350 MG/ML SOLN COMPARISON:  None. FINDINGS: Cardiovascular: There is no demonstrable pulmonary embolus. There is no thoracic aortic aneurysm or dissection. Visualized great vessels appear unremarkable. No appreciable pericardial effusion or pericardial thickening. Mediastinum/Nodes: Visualized thyroid appears unremarkable. No evident thoracic adenopathy. No esophageal lesions are appreciable. Lungs/Pleura: Lungs are clear. No pleural effusions evident. No pneumothorax. Trachea and major bronchial structures appear patent. Upper Abdomen: Visualized upper abdominal structures appear unremarkable. Musculoskeletal: No blastic or lytic bone lesions. No evident chest wall lesions. Review of the MIP images confirms the above findings. IMPRESSION: 1. No evident pulmonary embolus. No thoracic aortic aneurysm or dissection. 2.  Lungs clear. 3.  No thoracic adenopathy. Electronically Signed   By: Bretta Bang III M.D.   On: 09/28/2020 15:53       Assessment & Plan:   Problem List Items Addressed This Visit     BMI 31.0-31.9,adult    Discussed diet, exercise and weight loss.  Discussed treatment.  Desires looking into the possibility of wegovy.  Discussed CCM referral.  Agrees.          Relevant Orders   AMB Referral to Community Care Coordinaton   Hypercholesterolemia    Low cholesterol diet and exercise.  Follow lipid panel.        Hypertension, essential    Started on carvedilol.  Blood pressure as outlined.  Discussed diet, exercise and weight loss.  Hold on changing dose of coreg.  Follow pressures.  Follow metabolic panel.        Palpitations    On carvedilol.  Palpitations better.  Follow.         Stress    Overall appears to be doing better.  School out soon.  Follow.           [place], MD

## 2020-12-02 ENCOUNTER — Ambulatory Visit: Payer: Self-pay | Admitting: Internal Medicine

## 2020-12-05 ENCOUNTER — Encounter: Payer: Self-pay | Admitting: Internal Medicine

## 2020-12-05 DIAGNOSIS — Z6831 Body mass index (BMI) 31.0-31.9, adult: Secondary | ICD-10-CM | POA: Insufficient documentation

## 2020-12-05 NOTE — Assessment & Plan Note (Signed)
Low cholesterol diet and exercise.  Follow lipid panel.   

## 2020-12-05 NOTE — Assessment & Plan Note (Signed)
Discussed diet, exercise and weight loss.  Discussed treatment.  Desires looking into the possibility of wegovy.  Discussed CCM referral.  Agrees.

## 2020-12-05 NOTE — Assessment & Plan Note (Signed)
Overall appears to be doing better.  School out soon.  Follow.

## 2020-12-05 NOTE — Assessment & Plan Note (Signed)
On carvedilol.  Palpitations better.  Follow.

## 2020-12-05 NOTE — Assessment & Plan Note (Signed)
Started on carvedilol.  Blood pressure as outlined.  Discussed diet, exercise and weight loss.  Hold on changing dose of coreg.  Follow pressures.  Follow metabolic panel.

## 2020-12-15 ENCOUNTER — Telehealth: Payer: Self-pay

## 2020-12-15 NOTE — Chronic Care Management (AMB) (Signed)
  Care Management   Outreach Note  12/15/2020 Name: Cassandra Greene MRN: [id number] DOB: [birth year]  Referred by: Dale [place], MD Reason for referral : Care Coordination (Outreach to schedule referral with Pharm D )   An unsuccessful telephone outreach was attempted today. The patient was referred to the case management team for assistance with care management and care coordination.   Follow Up Plan: A HIPAA compliant phone message was left for the patient providing contact information and requesting a return call.  The care management team will reach out to the patient again over the next 7 days.  If patient returns call to provider office, please advise to call Embedded Care Management Care Guide Penne Lash  at [phone]  Penne Lash, RMA Care Guide, Embedded Care Coordination [facility]  [place], [place] [address] Direct Dial: [phone] [email or web address] Website: [email or web address]

## 2020-12-22 NOTE — Chronic Care Management (AMB) (Signed)
  Care Management   Note  12/22/2020 Name: Cassandra Greene MRN: [id number] DOB: 26-May-1976  MCKYNLIE VANDERSLICE is a 45 y.o. year old female who is a primary care patient of Dale [place], MD. I reached out to [name] by phone today in response to a referral sent by Ms. Lise Auer Ocall[place]han's health plan.    Ms. Arambula was given information about care management services today including:  Care management services include personalized support from designated clinical staff supervised by her physician, including individualized plan of care and coordination with other care providers 24/7 contact phone numbers for assistance for urgent and routine care needs. The patient may stop care management services at any time by phone call to the office staff.  Patient agreed to services and verbal consent obtained.   Follow up plan: Telephone appointment with care management team member scheduled for:01/21/2021  Penne Lash, RMA Care Guide, Embedded Care Coordination [facility]  [place], [place] [address] Direct Dial: [phone] [email or web address]

## 2020-12-23 ENCOUNTER — Encounter: Payer: Self-pay | Admitting: Internal Medicine

## 2021-01-11 ENCOUNTER — Other Ambulatory Visit: Payer: Self-pay | Admitting: Internal Medicine

## 2021-01-11 DIAGNOSIS — Z1231 Encounter for screening mammogram for malignant neoplasm of breast: Secondary | ICD-10-CM

## 2021-01-18 ENCOUNTER — Ambulatory Visit: Payer: Self-pay | Admitting: Internal Medicine

## 2021-01-21 ENCOUNTER — Ambulatory Visit: Payer: BC Managed Care – PPO | Admitting: Pharmacist

## 2021-01-21 DIAGNOSIS — E559 Vitamin D deficiency, unspecified: Secondary | ICD-10-CM

## 2021-01-21 DIAGNOSIS — Z6831 Body mass index (BMI) 31.0-31.9, adult: Secondary | ICD-10-CM

## 2021-01-21 DIAGNOSIS — E7800 Pure hypercholesterolemia, unspecified: Secondary | ICD-10-CM

## 2021-01-21 DIAGNOSIS — I10 Essential (primary) hypertension: Secondary | ICD-10-CM

## 2021-01-21 MED ORDER — OZEMPIC (0.25 OR 0.5 MG/DOSE) 2 MG/1.5ML ~~LOC~~ SOPN: PEN_INJECTOR | SUBCUTANEOUS | 0 refills | Status: DC

## 2021-01-21 NOTE — Patient Instructions (Signed)
Visit Information  PATIENT GOALS:   Goals Addressed               This Visit's Progress     Patient Stated     Medication Monitoring (pt-stated)        Patient Goals/Self-Care Activities Over the next 90 days, patient will:  - take medications as prescribed target a minimum of 150 minutes of moderate intensity exercise weekly engage in dietary modifications by moderating portion sizes        Ms. Rann was given information about Care Management services by the embedded care coordination team including:  Care Management services include personalized support from designated clinical staff supervised by her physician, including individualized plan of care and coordination with other care providers 24/7 contact phone numbers for assistance for urgent and routine care needs. The patient may stop CCM services at any time (effective at the end of the month) by phone call to the office staff.  Patient agreed to services and verbal consent obtained.   Patient verbalizes understanding of instructions provided today and agrees to view in MyChart.   Plan: Telephone follow up appointment with care management team member scheduled for:  ~ 8 weeks   Catie Feliz Beam, PharmD, [place], CPP Clinical Pharmacist [facility] at [employer] [phone]

## 2021-01-21 NOTE — Chronic Care Management (AMB) (Signed)
Care Management   Pharmacy Note  01/21/2021 Name: Cassandra Greene MRN: [id number] DOB: [birth year]  Subjective: AIANNA Greene is a 45 y.o. year old female who is a primary care patient of Cassandra [place], Greene. The Care Management team was consulted for assistance with care management and care coordination needs.    Engaged with patient by telephone for initial visit in response to provider referral for pharmacy case management and/or care coordination services.   The patient was given information about Care Management services today including:  Care Management services includes personalized support from designated clinical staff supervised by the patient's primary care provider, including individualized plan of care and coordination with other care providers. 24/7 contact phone numbers for assistance for urgent and routine care needs. The patient may stop case management services at any time by phone call to the office staff.  Patient agreed to services and consent obtained.  Assessment:  Review of patient status, including review of consultants reports, laboratory and other test data, was performed as part of comprehensive evaluation and provision of chronic care management services.   SDOH (Social Determinants of Health) assessments and interventions performed:  SDOH Interventions    Flowsheet Row Most Recent Value  SDOH Interventions   Financial Strain Interventions Intervention Not Indicated        Objective:  Lab Results  Component Value Date   CREATININE 0.71 09/28/2020   CREATININE 0.84 10/02/2019   CREATININE 0.76 08/30/2018    Lab Results  Component Value Date   HGBA1C 5.2 08/30/2018       Component Value Date/Time   CHOL 227 (H) 10/02/2019 1141   CHOL 231 (H) 06/05/2015 1008   TRIG 158.0 (H) 10/02/2019 1141   TRIG 109 07/07/2016 0936   HDL 48.30 10/02/2019 1141   HDL 48 07/07/2016 0936   CHOLHDL 5 10/02/2019 1141   VLDL 31.6 10/02/2019 1141   LDLCALC  148 (H) 10/02/2019 1141   LDLCALC 147 (H) 06/05/2015 1008    Last vitamin D Lab Results  Component Value Date   VD25OH 20.9 (L) 08/30/2018    Clinical ASCVD: No  The 10-year ASCVD risk score Denman George DC Jr., et al., 2013) is: 1.5%   Values used to calculate the score:     Age: 68 years     Sex: Female     Is Non-Hispanic African American: No     Diabetic: No     Tobacco smoker: No     Systolic Blood Pressure: 126 mmHg     Is BP treated: Yes     HDL Cholesterol: 48.3 mg/dL     Total Cholesterol: 227 mg/dL     BP Readings from Last 3 Encounters:  11/26/20 122/80  10/21/20 124/80  10/02/20 112/82    Care Plan  No Known Allergies  Medications Reviewed Today     Reviewed by Lourena Simmonds, RPH-CPP (Pharmacist) on 01/21/21 at 1350  Med List Status: <None>   Medication Order Taking? Sig Documenting Provider Last Dose Status Informant  ALPRAZolam (XANAX) 0.25 MG tablet [id number] No Take 1 tablet (0.25 mg total) by mouth daily as needed for anxiety.  Patient not taking: Reported on 01/21/2021   Cassandra [place], Greene Not Taking Active   carvedilol (COREG) 3.125 MG tablet [id number] Yes Take 1 tablet (3.125 mg total) by mouth 2 (two) times daily with a meal. Cassandra [place], Greene Taking Active   ibuprofen (ADVIL,MOTRIN) 200 MG tablet [id number] Yes Take 200 mg by mouth every 6 (six) hours  as needed. Provider, Historical, Greene Taking Active   levonorgestrel (MIRENA) 20 MCG/24HR IUD [id number] Yes 1 each by Intrauterine route once. Provider, Historical, Greene Taking Active   Vitamin D, Ergocalciferol, (DRISDOL) 1.25 MG (50000 UNIT) CAPS capsule [id number] Yes TAKE 1 CAPSULE BY MOUTH TWICE PER WEEK Cassandra [place], Greene Taking Active             Patient Active Problem List   Diagnosis Date Noted   BMI 31.0-31.9,adult 12/05/2020   Hypertension, essential 10/22/2020   Palpitations 09/28/2020   History of environmental allergies 10/12/2019   Cervical cancer screening 10/12/2019   Stress  10/12/2019   Breast cancer screening 10/12/2019   Vitamin D deficiency 06/09/2015   Hypercholesterolemia 06/09/2015    Conditions to be addressed/monitored: HTN, HLD, and Obesity  Care Plan : Medication Management  Updates made by Lourena Simmonds, RPH-CPP since 01/21/2021 12:00 AM     Problem: Obesity, HTN, HLD      Long-Range Goal: Disease Progression Prevention   Start Date: 01/21/2021  This Visit's Progress: On track  Priority: High  Note:   Current Barriers:  Unable to achieve control of weight  Pharmacist Clinical Goal(s):  Over the next 90 days, patient will achieve improvement in weight as through collaboration with PharmD and provider.   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  Obesity with Comorbidities (HTN, HLD, GERD): Unable to lose weight with lifestyle modification alone; Hx phentermine - did not like how it made her feel Hx bupropion - no benefit Current meal patterns: breakfast: sometimes skips; lunch/supper: vegetables, proteins, carbs, cooks for her teenage boys that are athletes; reports that she likes to cook and shows her love through cooking; snacks: infrequent snacks/desserts but finds it hard to resist when things are in the home; drinks: water, sometimes flavoring packets Current exercise: 6 am [employer] class 3 days per week, runs on Tues/Thurs with son for 2 [place]; loves to walk; very active Extensive dietary and lifestyle education. Praised for exercise. Praised for focus on home cooked meals with proteins, vegetables and fruits, avoidance of sugary beverages. Discussed appropriate use of GLP1  for weight management given BMI >31 and comorbidities that stand to benefit from weight loss - HTN, HLD, GERD. Counseled on GLP1 agonists, including mechanism of action, side effects, and benefits. No personal or family history of medullary thyroid cancer, personal history of pancreatitis or gallbladder disease. Counseled on potential side effects of nausea, stomach upset, queasiness, constipation, and that these generally improve over time. Advised to contact our office with more severe symptoms, including nausea, diarrhea, stomach pain. Patient verbalized understanding. Discussed Wegovy back order. Will investigate insurance coverage to determine if this will be an option at the highest dose.  In the meantime, start Ozempic. Will provide sample. Inject 0.25 mg weekly for 4 weeks then increase to 0.5 mg weekly. Will plan to continue Ozempic until highest dose when we can transition to [facility].   Hypertension: Controlled; current treatment: carvedilol 3.25 mg BID;  Current home readings: did not discuss today Continue current regimen at this time. Will evaluate BP control moving forward.   Hyperlipidemia: Elevated; current treatment: none;  10 year ASCVD score 1.5% No indication for pharmacotherapy at this time.  GERD: Controlled per patient report; current regimen: lansoprazole 15 mg daily Continue current regimen at this time. Discussed beneficial impact of weight loss on GERd .  Contraception: Mirena IUD  Supplement: Prescribed Vitamin D 50,000 units twice weekly but admits she often forgets. She notes its hard to remember to take a pill that isn't every day. Suggested at least purchasing an OTC Vitamin D supplement and taking daily.    Patient Goals/Self-Care Activities Over the next 90 days, patient will:  - take medications as prescribed target a minimum of 150 minutes of moderate intensity exercise weekly engage in dietary modifications by moderating portion sizes  Follow Up  Plan: Telephone follow up appointment with care management team member scheduled for: ~ 8 weeks      Medication Assistance:  None required.  Patient affirms current coverage meets needs.  Follow Up:  Patient agrees to Care Plan and Follow-up.  Plan: Telephone follow up appointment with care management team member scheduled for:  ~ 8 weeks  Catie Feliz Beam, PharmD, [place], CPP Clinical Pharmacist [facility] at [facility] [phone]  Medication Samples have been provided to the patient.  Drug name: Ozempic       Strength: 0.25/0.5 mg        Qty: 1.5 mL                LOT: [id number]                        Exp.Date: 07/27/2022

## 2021-01-22 ENCOUNTER — Other Ambulatory Visit: Payer: Self-pay

## 2021-01-22 ENCOUNTER — Ambulatory Visit
Admission: RE | Admit: 2021-01-22 | Discharge: 2021-01-22 | Disposition: A | Discharge status: Home / Self Care | Payer: BC Managed Care – PPO | Source: Ambulatory Visit | Attending: Internal Medicine | Admitting: Internal Medicine

## 2021-01-22 ENCOUNTER — Ambulatory Visit: Payer: BC Managed Care – PPO | Admitting: Pharmacist

## 2021-01-22 DIAGNOSIS — E7800 Pure hypercholesterolemia, unspecified: Secondary | ICD-10-CM

## 2021-01-22 DIAGNOSIS — Z1231 Encounter for screening mammogram for malignant neoplasm of breast: Secondary | ICD-10-CM

## 2021-01-22 DIAGNOSIS — I10 Essential (primary) hypertension: Secondary | ICD-10-CM

## 2021-01-22 DIAGNOSIS — Z6831 Body mass index (BMI) 31.0-31.9, adult: Secondary | ICD-10-CM

## 2021-01-22 IMAGING — MG MM DIGITAL SCREENING BILAT W/ TOMO AND CAD
8 series · 8 of 24 positions shown · non-contrast
Comparison: Previous exam(s).

CLINICAL DATA: Screening.

EXAM:
DIGITAL SCREENING BILATERAL MAMMOGRAM WITH TOMOSYNTHESIS AND CAD
TECHNIQUE: Bilateral screening digital craniocaudal and mediolateral oblique
mammograms were obtained. Bilateral screening digital breast
tomosynthesis was performed. The images were evaluated with
computer-aided detection.

[L CC synth-2D]
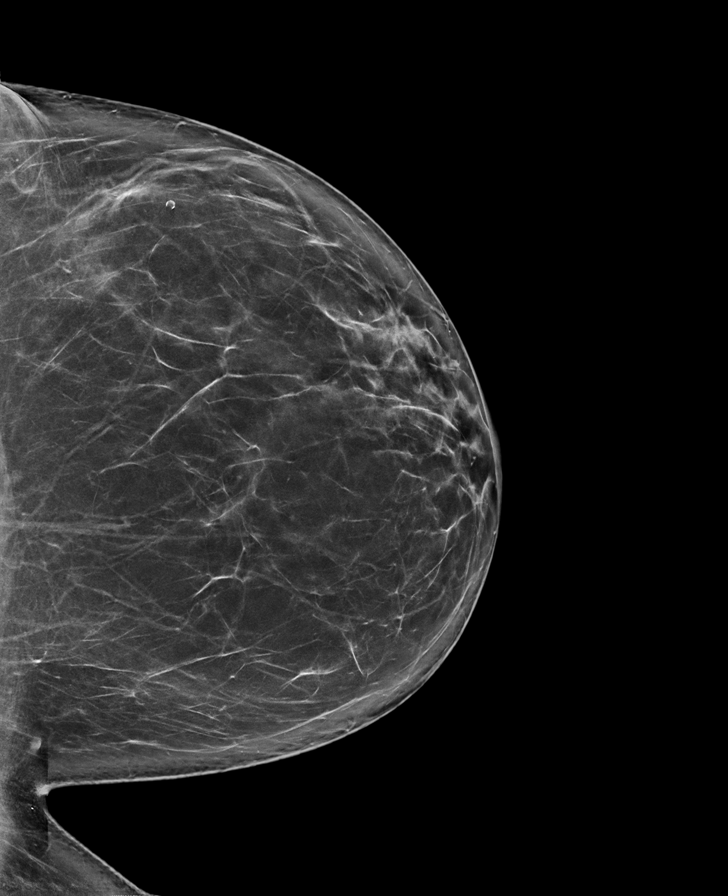

[R CC synth-2D]
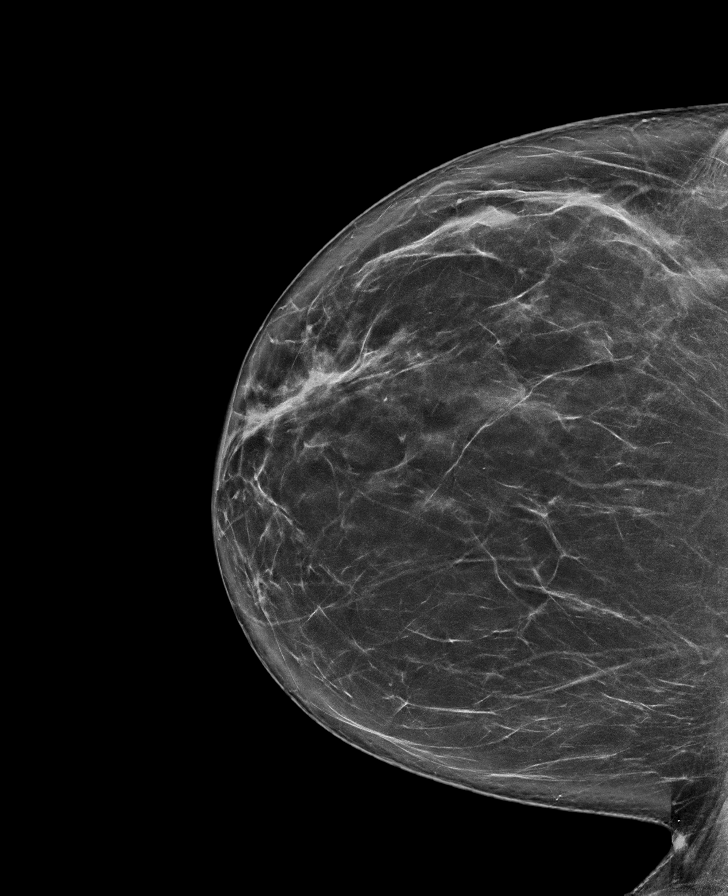

[R MLO synth-2D]
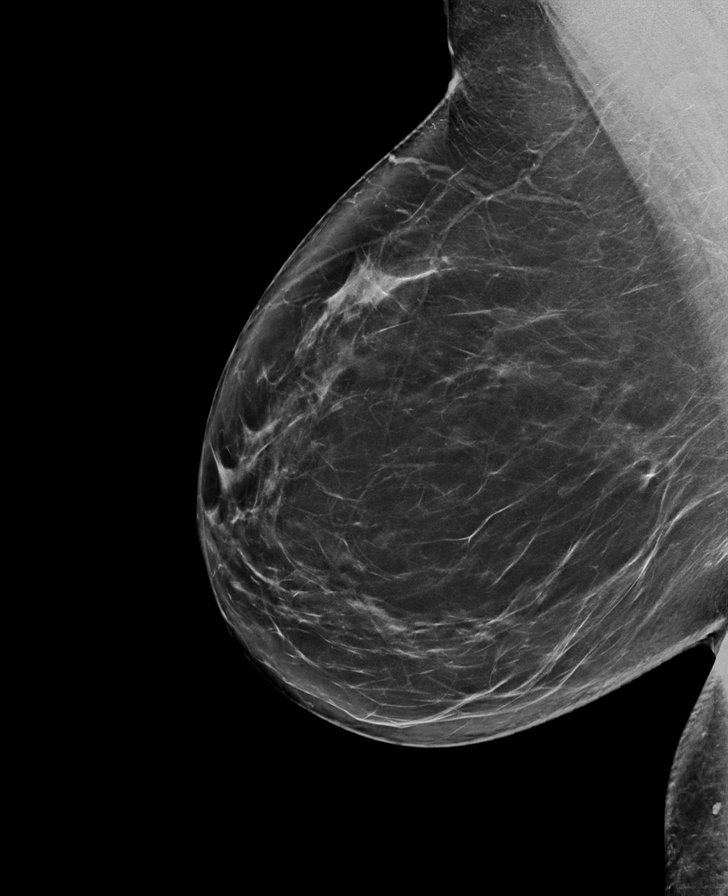

[L MLO synth-2D]
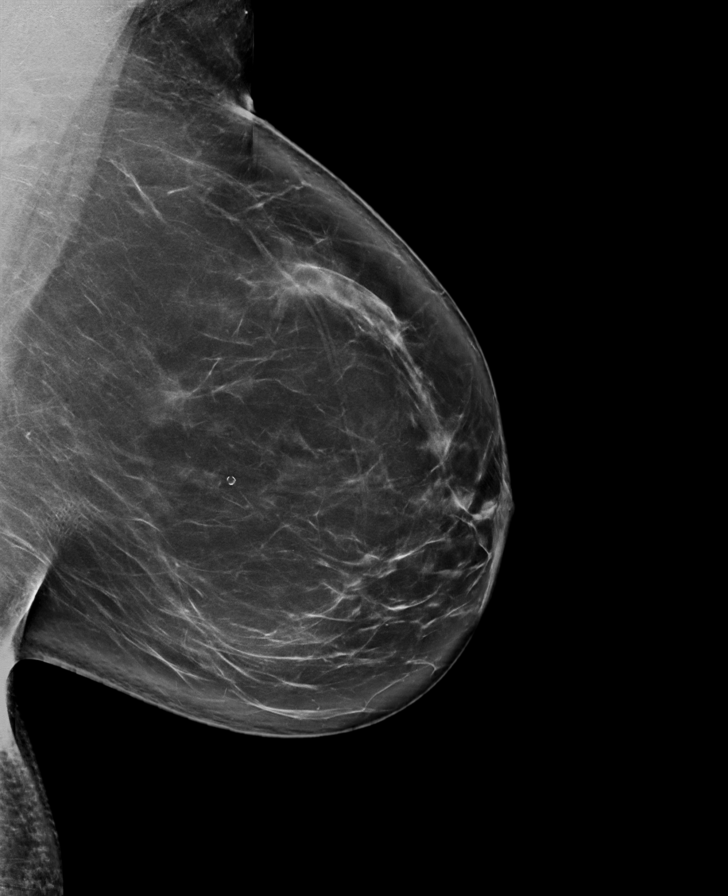

[R MLO tomo · tomo slice 47/94.0]
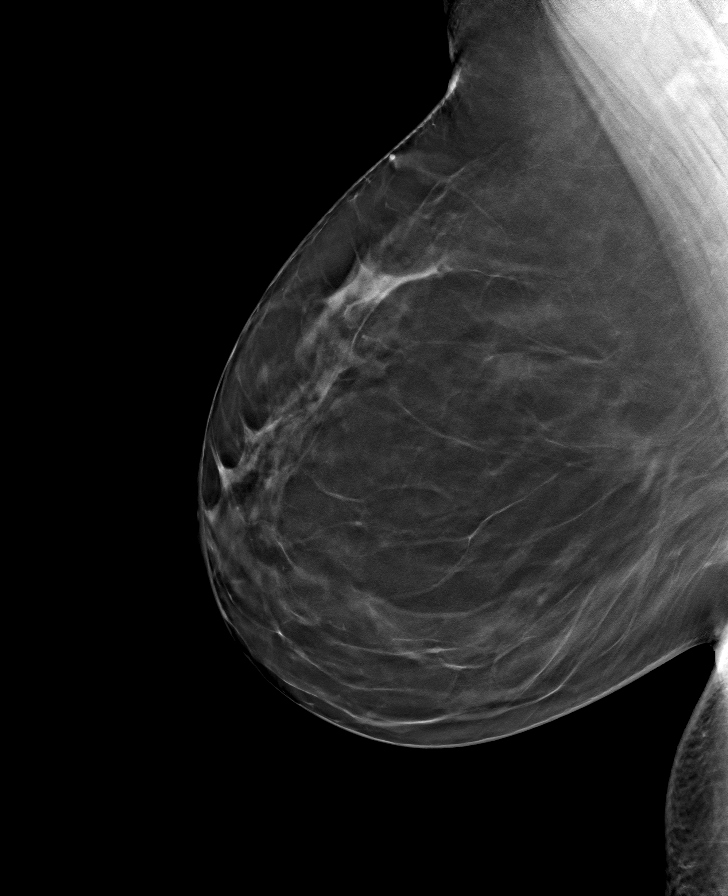

[L CC tomo · tomo slice 44/87.0]
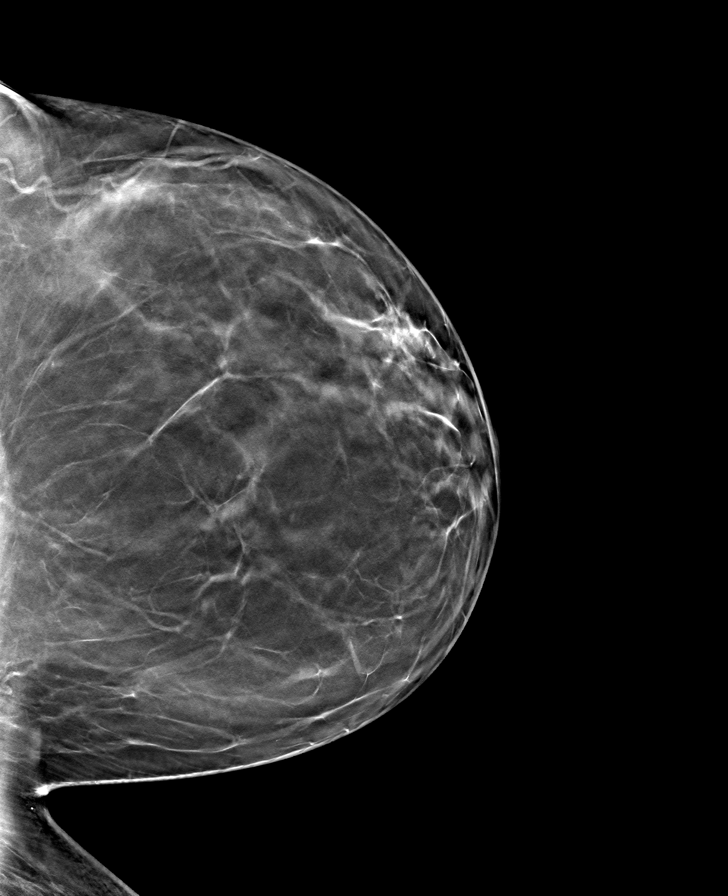

[L MLO tomo · tomo slice 53/105.0]
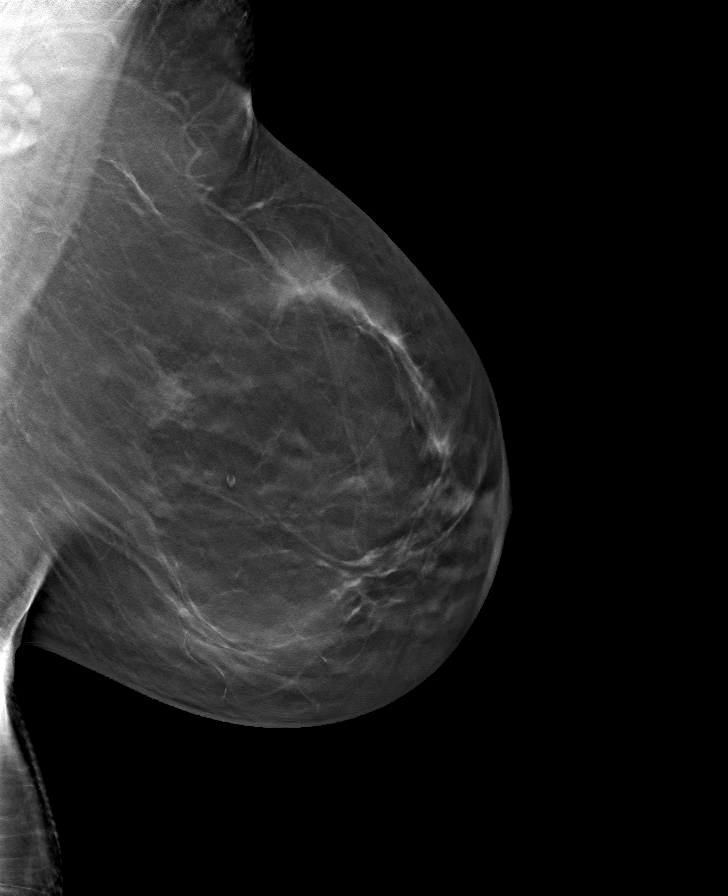

[R CC tomo · tomo slice 43/84.0]
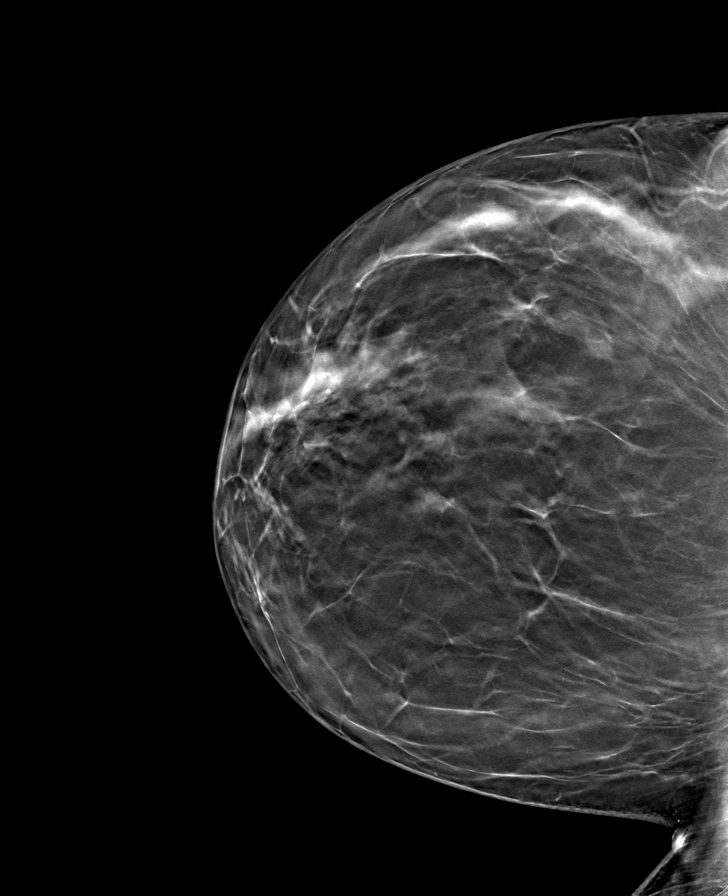

[8 of 24 positions shown; findings below may reference images not displayed]

ACR Breast Density Category b: There are scattered areas of
fibroglandular density.
FINDINGS: In the right breast, a possible mass warrants further evaluation. In
the left breast, no findings suspicious for malignancy.
IMPRESSION: Further evaluation is suggested for a possible mass in the right
breast.

RECOMMENDATION:
Diagnostic mammogram and possibly ultrasound of the right breast.
(Code:[id number]-E-WW6)

The patient will be contacted regarding the findings, and additional
imaging will be scheduled.

BI-RADS CATEGORY  0: Incomplete. Need additional imaging evaluation
and/or prior mammograms for comparison.

## 2021-01-22 MED ORDER — OZEMPIC (0.25 OR 0.5 MG/DOSE) 2 MG/1.5ML ~~LOC~~ SOPN: 0.5000 mg | PEN_INJECTOR | SUBCUTANEOUS | 2 refills | Status: DC

## 2021-01-22 NOTE — Chronic Care Management (AMB) (Signed)
Care Management   Pharmacy Note  01/22/2021 Name: Cassandra MIKKELSON MRN: [id number] DOB: July 10, 1975  Subjective: Cassandra Greene is a 45 y.o. year old female who is a primary care patient of Cassandra [place], MD. The Care Management team was consulted for assistance with care management and care coordination needs.    Care coordination  for  medication access  in response to provider referral for pharmacy case management and/or care coordination services.   The patient was given information about Care Management services today including:  Care Management services includes personalized support from designated clinical staff supervised by the patient's primary care provider, including individualized plan of care and coordination with other care providers. 24/7 contact phone numbers for assistance for urgent and routine care needs. The patient may stop case management services at any time by phone call to the office staff.  Patient agreed to services and consent obtained.  Assessment:  Review of patient status, including review of consultants reports, laboratory and other test data, was performed as part of comprehensive evaluation and provision of chronic care management services.   SDOH (Social Determinants of Health) assessments and interventions performed:  SDOH Interventions    Flowsheet Row Most Recent Value  SDOH Interventions   Financial Strain Interventions Intervention Not Indicated        Objective:  Lab Results  Component Value Date   CREATININE 0.71 09/28/2020   CREATININE 0.84 10/02/2019   CREATININE 0.76 08/30/2018    Lab Results  Component Value Date   HGBA1C 5.2 08/30/2018       Component Value Date/Time   CHOL 227 (H) 10/02/2019 1141   CHOL 231 (H) 06/05/2015 1008   TRIG 158.0 (H) 10/02/2019 1141   TRIG 109 07/07/2016 0936   HDL 48.30 10/02/2019 1141   HDL 48 07/07/2016 0936   CHOLHDL 5 10/02/2019 1141   VLDL 31.6 10/02/2019 1141   LDLCALC 148 (H)  10/02/2019 1141   LDLCALC 147 (H) 06/05/2015 1008    Clinical ASCVD: No  The 10-year ASCVD risk score Denman George DC Jr., et al., 2013) is: 1.5%   Values used to calculate the score:     Age: 59 years     Sex: Female     Is Non-Hispanic African American: No     Diabetic: No     Tobacco smoker: No     Systolic Blood Pressure: 126 mmHg     Is BP treated: Yes     HDL Cholesterol: 48.3 mg/dL     Total Cholesterol: 227 mg/dL     BP Readings from Last 3 Encounters:  11/26/20 122/80  10/21/20 124/80  10/02/20 112/82    Care Plan  No Known Allergies  Medications Reviewed Today     Reviewed by Lourena Simmonds, RPH-CPP (Pharmacist) on 01/21/21 at 1350  Med List Status: <None>   Medication Order Taking? Sig Documenting Provider Last Dose Status Informant  ALPRAZolam (XANAX) 0.25 MG tablet [id number] No Take 1 tablet (0.25 mg total) by mouth daily as needed for anxiety.  Patient not taking: Reported on 01/21/2021   Cassandra Gnadenhutten, MD Not Taking Active   carvedilol (COREG) 3.125 MG tablet [id number] Yes Take 1 tablet (3.125 mg total) by mouth 2 (two) times daily with a meal. Cassandra Harney, MD Taking Active   ibuprofen (ADVIL,MOTRIN) 200 MG tablet [id number] Yes Take 200 mg by mouth every 6 (six) hours as needed. [provider] Taking Active   levonorgestrel (MIRENA) 20 MCG/24HR IUD [id number] Yes 1 each  by Intrauterine route once. [provider] Taking Active   Vitamin D, Ergocalciferol, (DRISDOL) 1.25 MG (50000 UNIT) CAPS capsule [id number] Yes TAKE 1 CAPSULE BY MOUTH TWICE PER WEEK Cassandra Cassandra [place], MD Taking Active             Patient Active Problem List   Diagnosis Date Noted   BMI 31.0-31.9,adult 12/05/2020   Hypertension, essential 10/22/2020   Palpitations 09/28/2020   History of environmental allergies 10/12/2019   Cervical cancer screening 10/12/2019   Stress 10/12/2019   Breast cancer screening 10/12/2019   Vitamin D deficiency 06/09/2015    Hypercholesterolemia 06/09/2015    Conditions to be addressed/monitored: HTN, HLD, and obesity  Care Plan : Medication Management  Updates made by Lourena Simmonds, RPH-CPP since 01/22/2021 12:00 AM     Problem: Obesity, HTN, HLD      Long-Range Goal: Disease Progression Prevention   Start Date: 01/21/2021  Recent Progress: On track  Priority: High  Note:   Current Barriers:  Unable to achieve control of weight  Pharmacist Clinical Goal(s):  Over the next 90 days, patient will achieve improvement in weight as through collaboration with PharmD and provider.   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  Obesity with Comorbidities (HTN, HLD, GERD): Unable to lose weight with lifestyle modification alone; Hx phentermine - did not like how it made her feel Hx bupropion - no benefit Current meal patterns: breakfast: sometimes skips; lunch/supper: vegetables, proteins, carbs, cooks for her teenage boys that are athletes; reports that she likes to cook and shows her love through cooking; snacks: infrequent snacks/desserts but finds it hard to resist when things are in the home; drinks: water, sometimes flavoring packets Current exercise: 6 am [employer] class 3 days per week, runs on Tues/Thurs with son for 2 [place]; loves to walk; very active PA for [facility] was approved. Will plan to titrate Ozempic up to maximum dose and switch to [facility], due to [facility] back order. Sent script for Ozempic, PA not required, copay was $47. Asked pharmacy to put on hold. Will start with sample and provide patient information on copay card available on  manufacturer website.   Hypertension: Controlled; current treatment: carvedilol 3.25 mg BID; Previously recommended to continue current regimen at this time. Will evaluate BP control moving forward.   Hyperlipidemia: Elevated; current treatment: none;  10 year ASCVD score 1.5% No indication for pharmacotherapy at this time.   GERD: Controlled per patient report; current regimen: lansoprazole 15 mg daily Previously recommended to continue current regimen at this time. Discussed beneficial impact of weight loss on GERD.  Contraception: Mirena IUD  Supplement: Prescribed Vitamin D 50,000 units twice weekly but admits she often forgets. She notes its hard to remember to take a pill that isn't every day. Previously suggested at least purchasing an OTC Vitamin D supplement and taking daily.    Patient Goals/Self-Care Activities Over the next 90 days, patient will:  - take medications as prescribed target a minimum of 150 minutes of moderate intensity exercise weekly engage in dietary modifications by moderating portion sizes  Follow Up Plan: Telephone follow up appointment with care management team member scheduled for: ~ 8 weeks as previously scheduled  Medication Assistance:  None required.  Patient affirms current coverage meets needs.  Follow Up:  Patient agrees to Care Plan and Follow-up.  Plan: Telephone follow up appointment with care management team member scheduled for:  ~8 weeks as previously scheduled   Catie Feliz Beam, PharmD, [place], CPP Clinical Pharmacist [employer] at [facility] [phone]

## 2021-01-22 NOTE — Patient Instructions (Signed)
Visit Information   Goals Addressed               This Visit's Progress     Patient Stated     Medication Monitoring (pt-stated)        Patient Goals/Self-Care Activities Over the next 90 days, patient will:  - take medications as prescribed target a minimum of 150 minutes of moderate intensity exercise weekly engage in dietary modifications by moderating portion sizes         Patient verbalizes understanding of instructions provided today and agrees to view in MyChart.   Plan: Telephone follow up appointment with care management team member scheduled for:  ~8 weeks as previously scheduled   Catie Feliz Beam, PharmD, [place], CPP Clinical Pharmacist [employer] at [facility] [phone]

## 2021-01-25 ENCOUNTER — Other Ambulatory Visit: Payer: Self-pay | Admitting: Internal Medicine

## 2021-01-25 DIAGNOSIS — N6310 Unspecified lump in the right breast, unspecified quadrant: Secondary | ICD-10-CM

## 2021-01-25 DIAGNOSIS — R928 Other abnormal and inconclusive findings on diagnostic imaging of breast: Secondary | ICD-10-CM

## 2021-01-28 ENCOUNTER — Telehealth: Payer: Self-pay | Admitting: Pharmacist

## 2021-01-28 NOTE — Telephone Encounter (Signed)
Called patient to follow up on pick up plans for the Ozempic sample prepared for her last week. Left voicemail asking her to come pick up Ozempic sample today or tomorrow, if possible.

## 2021-02-04 ENCOUNTER — Other Ambulatory Visit: Payer: Self-pay

## 2021-02-04 ENCOUNTER — Ambulatory Visit
Admission: RE | Admit: 2021-02-04 | Discharge: 2021-02-04 | Disposition: A | Discharge status: Home / Self Care | Payer: BC Managed Care – PPO | Source: Ambulatory Visit | Attending: Internal Medicine | Admitting: Internal Medicine

## 2021-02-04 DIAGNOSIS — N6310 Unspecified lump in the right breast, unspecified quadrant: Secondary | ICD-10-CM

## 2021-02-04 DIAGNOSIS — R928 Other abnormal and inconclusive findings on diagnostic imaging of breast: Secondary | ICD-10-CM

## 2021-02-04 IMAGING — MG MM DIGITAL DIAGNOSTIC UNILAT*R* W/ TOMO W/ CAD
6 of 10 series · 6 of 30 positions shown · non-contrast
Comparison: Previous exam(s).

CLINICAL DATA: 45-year-old female presenting as a recall from
screening for possible right breast mass.

EXAM:
DIGITAL DIAGNOSTIC UNILATERAL RIGHT MAMMOGRAM WITH TOMOSYNTHESIS AND
CAD; ULTRASOUND RIGHT BREAST LIMITED
TECHNIQUE: Right digital diagnostic mammography and breast tomosynthesis was
performed. The images were evaluated with computer-aided detection.;
Targeted ultrasound examination of the right breast was performed

[R ML synth-2D]
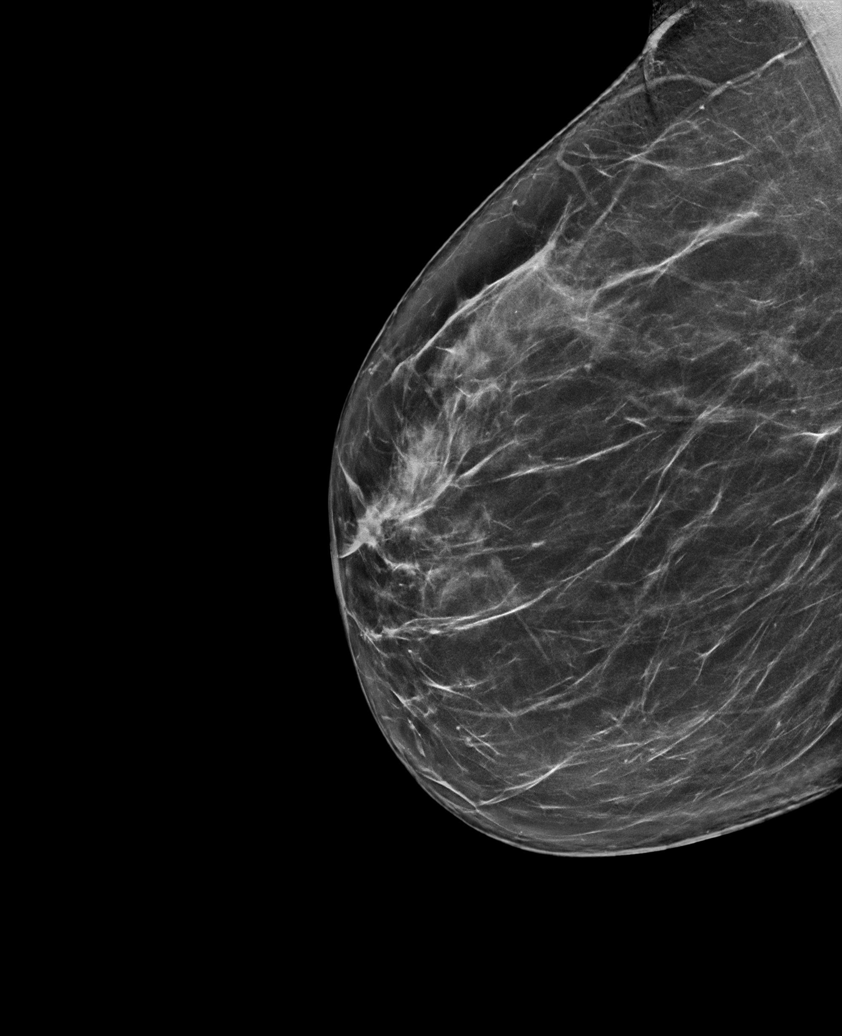

[R CC synth-2D (1 of 2)]
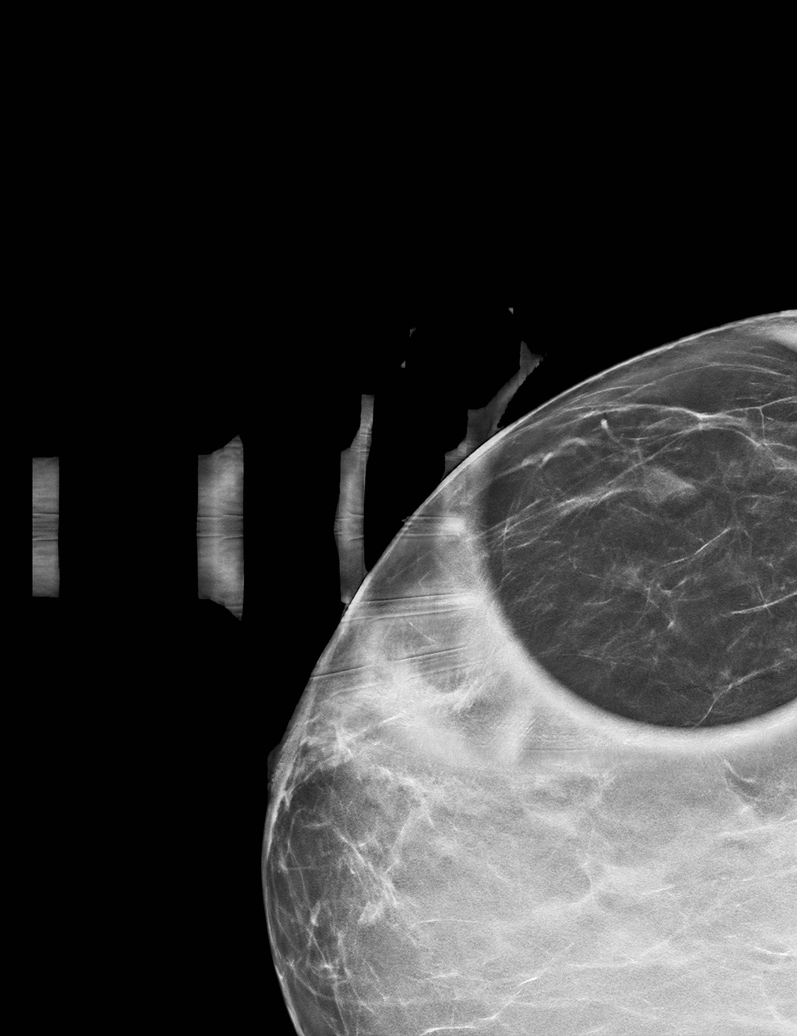

[R CC synth-2D (2 of 2)]
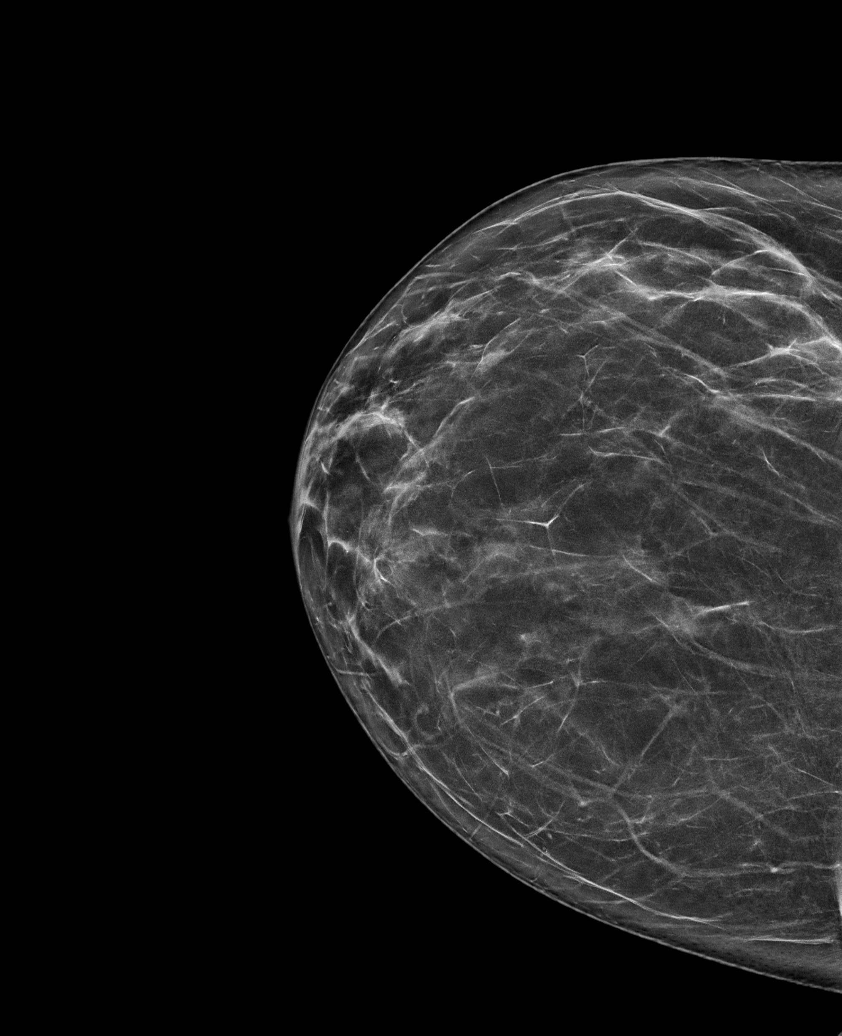

[R MLO synth-2D (1 of 2)]
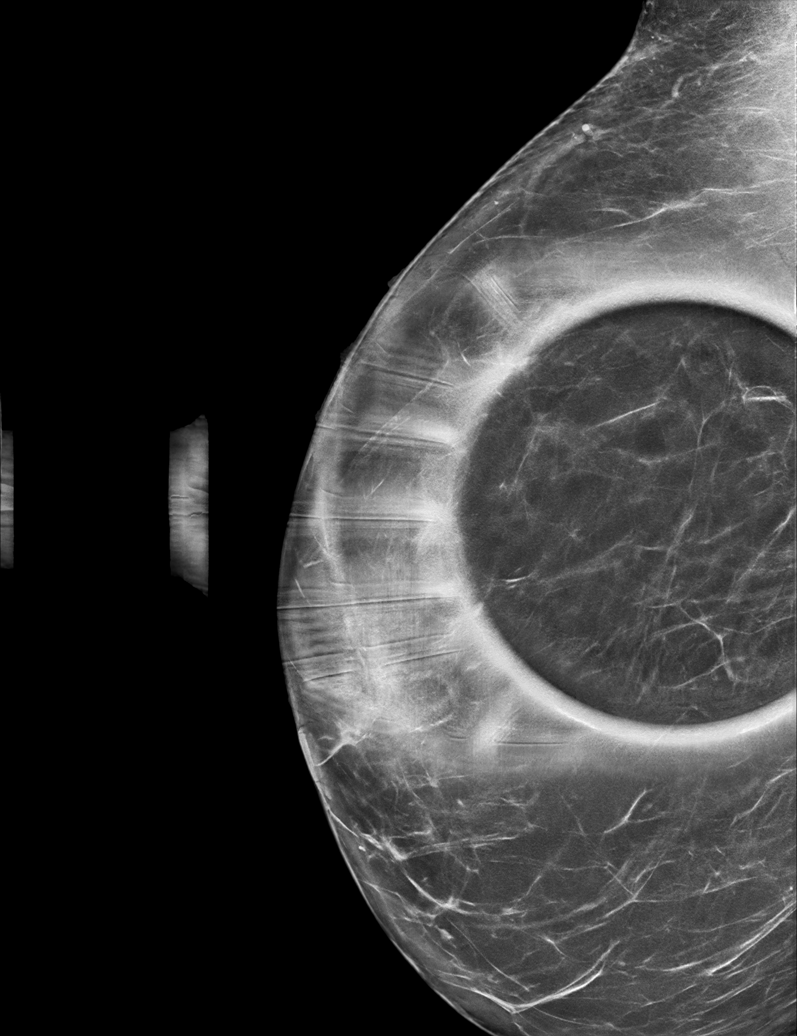

[R MLO synth-2D (2 of 2)]
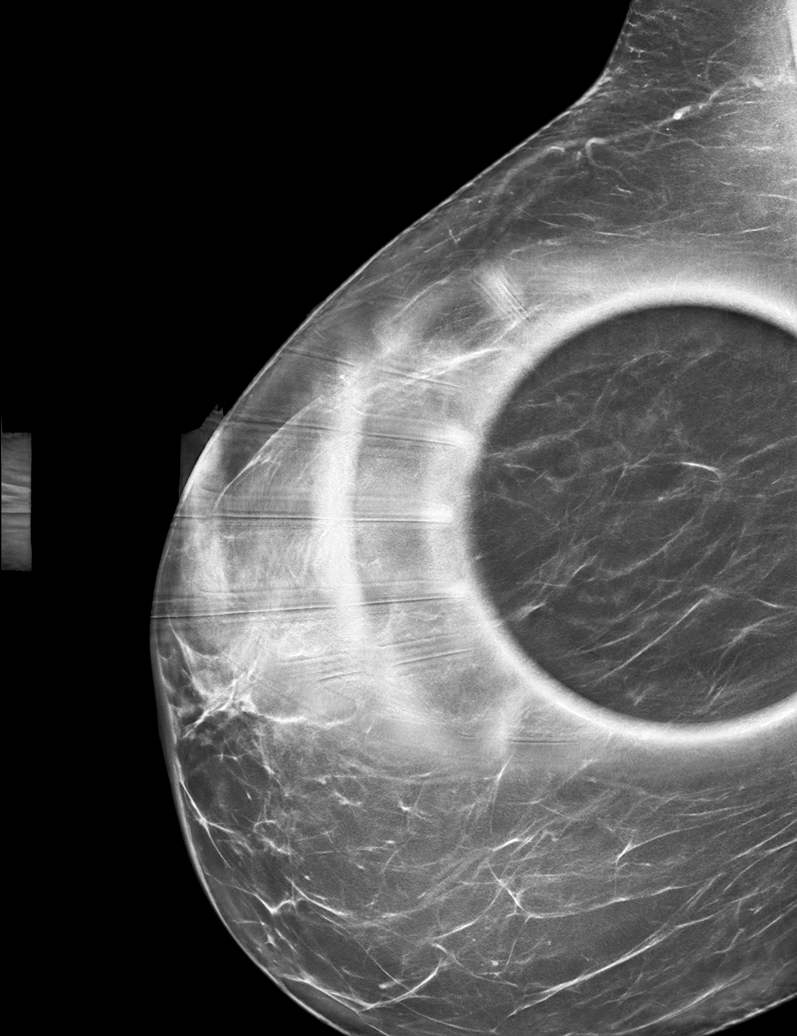

[R MLO tomo · tomo slice 38/75.0]
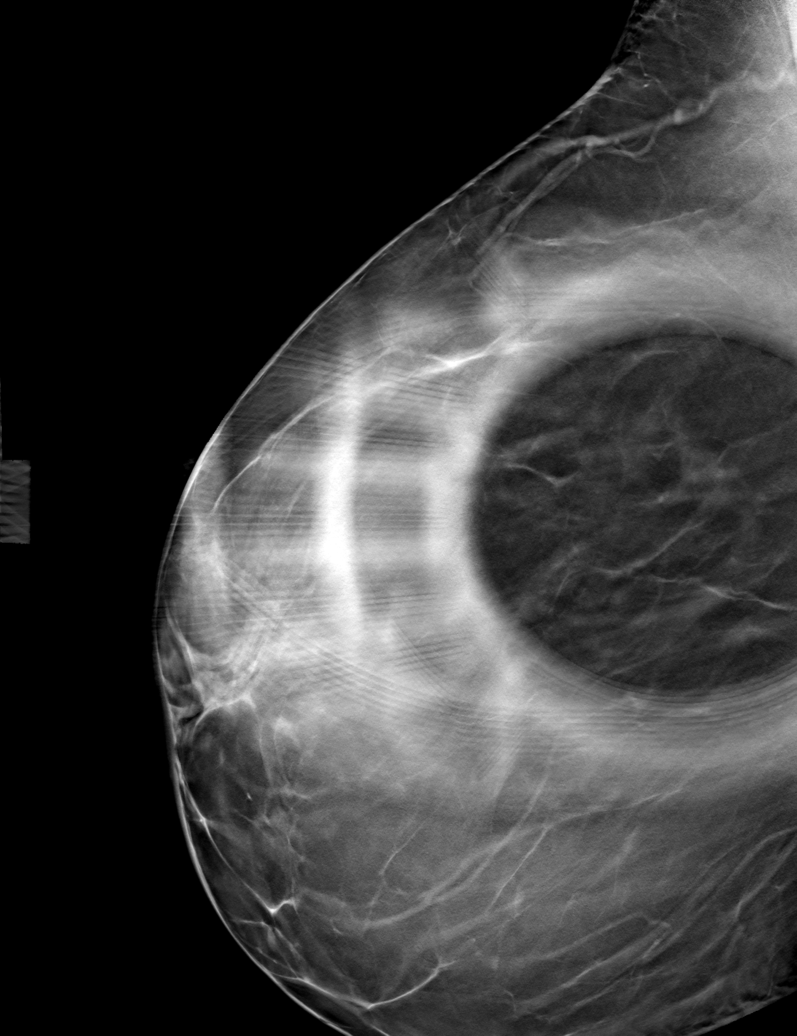

[6 of 30 positions shown; findings below may reference images not displayed]

ACR Breast Density Category b: There are scattered areas of
fibroglandular density.
FINDINGS: Mammogram:

Right breast: Spot compression tomosynthesis cc and MLO as well as
full field mL and rolled cc views of the right breast were
performed. The questioned mass seen on screening mammogram in the
outer right breast persists on the spot cc view though is less
conspicuous on lateral views. This measures approximately 1.1 cm.

Ultrasound:

Targeted ultrasound performed in the outer aspect of the right
breast demonstrating an oval circumscribed anechoic mass with
internal echoes at 9 o'clock 7 cm from the nipple the mass measures
1.6 x 0.4 x 1.1 cm. This corresponds to the mammographic finding. No
internal vascularity.

Targeted ultrasound the right axilla demonstrates multiple normal
lymph nodes.
IMPRESSION: Probably benign mass in the right breast at 9 o'clock, possibly a
complicated cyst/cluster of cysts.

RECOMMENDATION:
Right breast ultrasound in 6 months. Discussed with patient the
options of short-term follow-up versus biopsy. Patient prefers to
proceed with short-term follow-up.

BI-RADS CATEGORY  3: Probably benign.

## 2021-02-04 IMAGING — US US BREAST*R* LIMITED INC AXILLA
1 series · 7 of 7 positions shown · non-contrast
Comparison: Previous exam(s).

CLINICAL DATA: 45-year-old female presenting as a recall from
screening for possible right breast mass.

EXAM:
DIGITAL DIAGNOSTIC UNILATERAL RIGHT MAMMOGRAM WITH TOMOSYNTHESIS AND
CAD; ULTRASOUND RIGHT BREAST LIMITED
TECHNIQUE: Right digital diagnostic mammography and breast tomosynthesis was
performed. The images were evaluated with computer-aided detection.;
Targeted ultrasound examination of the right breast was performed

[Series 1: us breast*right* limited inc axilla · 0.06mm/px · 7 of 7 slices shown]
[im 1/7]
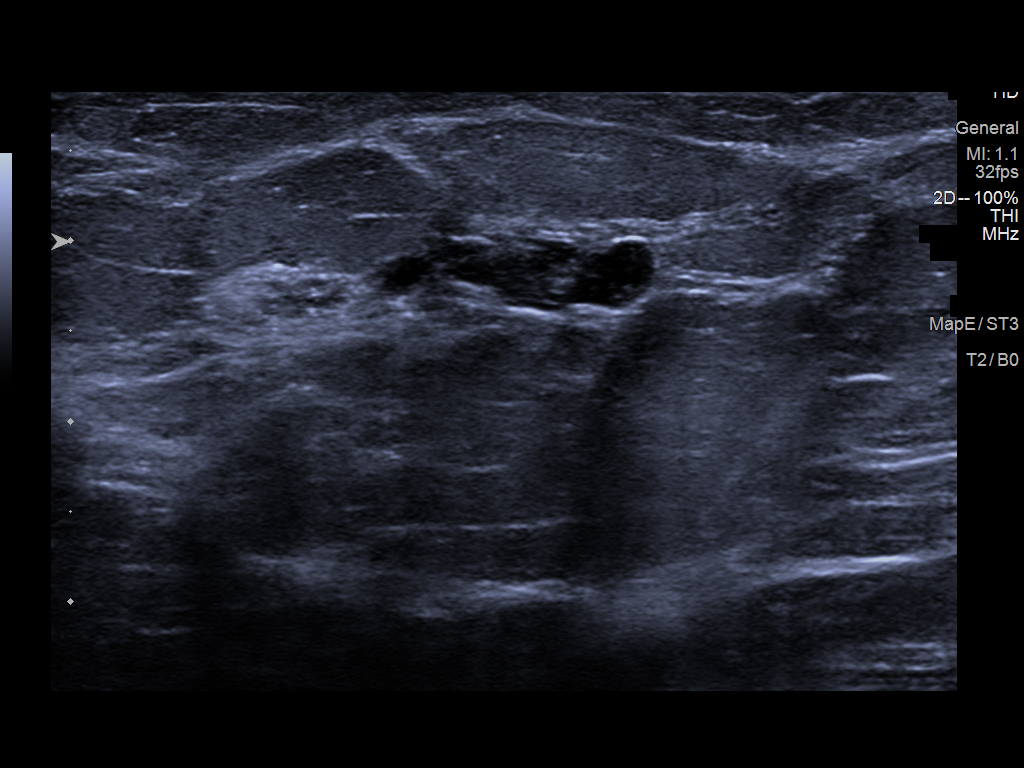
[im 2/7]
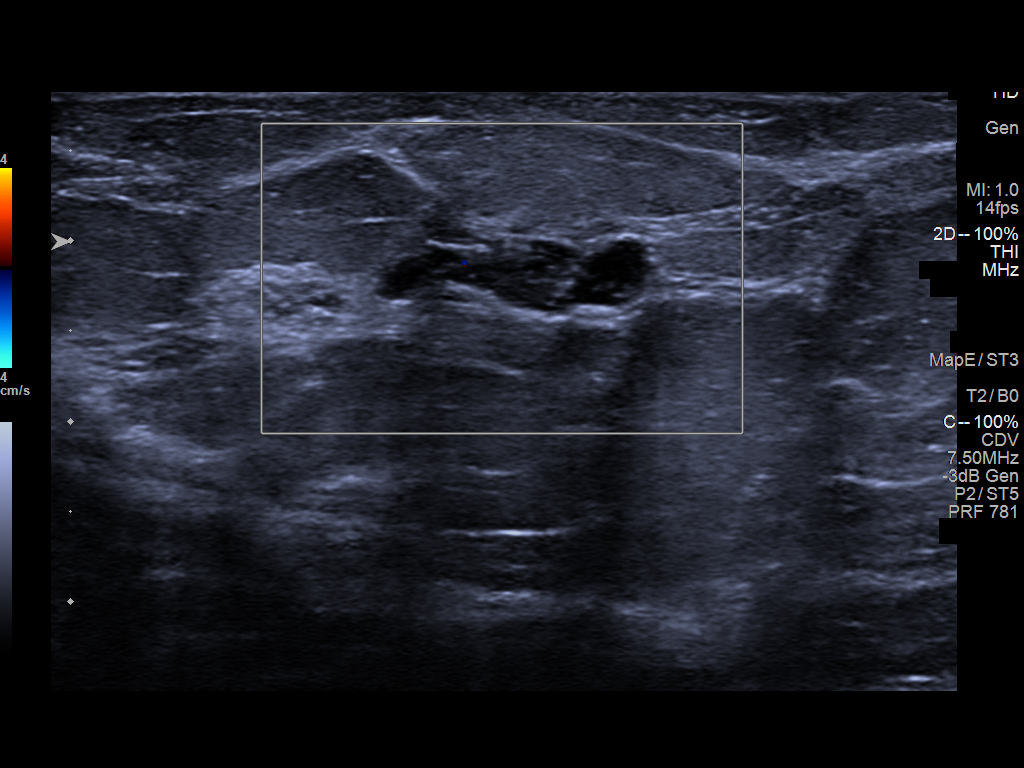
[im 3/7]
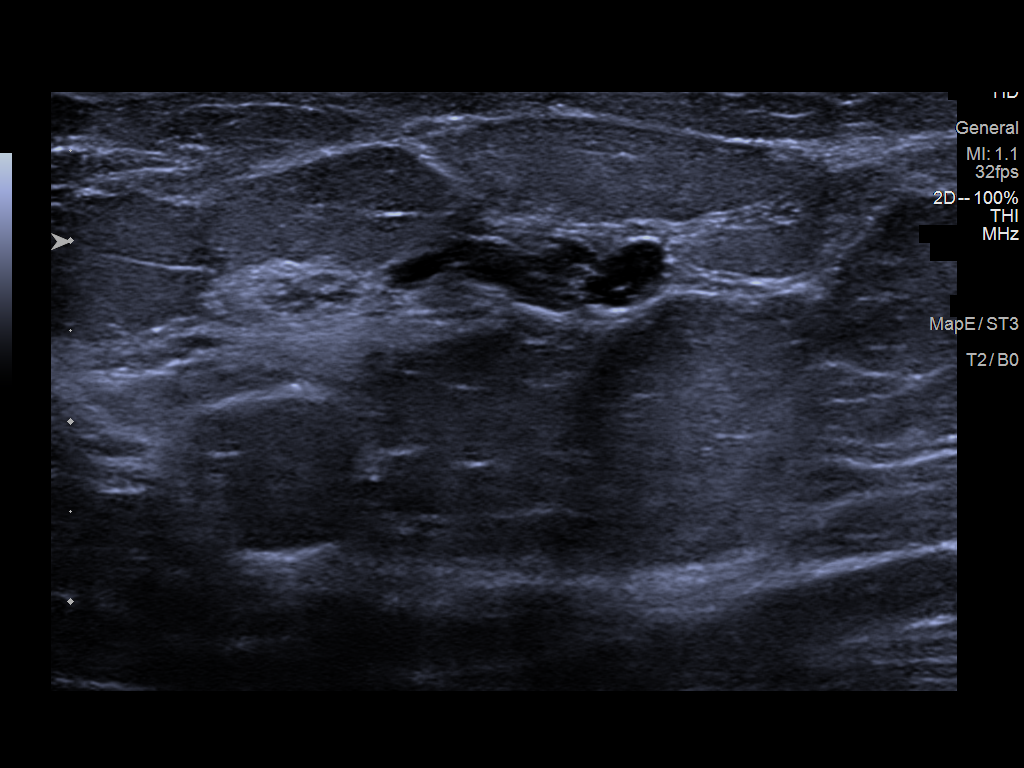
[im 4/7]
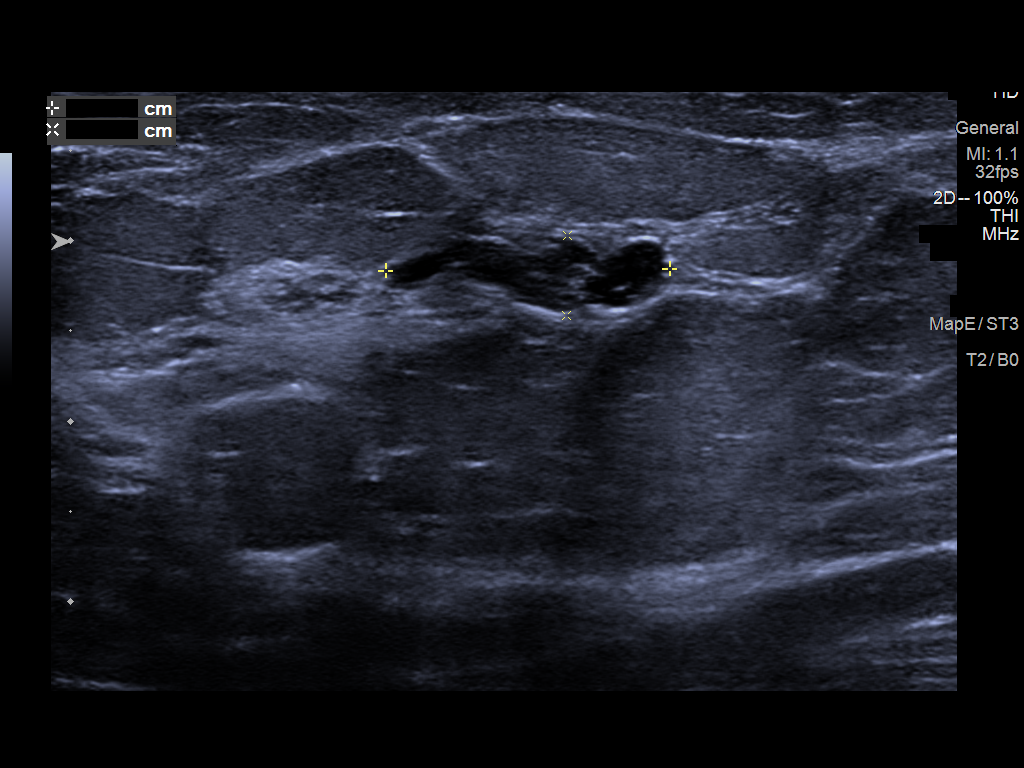
[im 5/7]
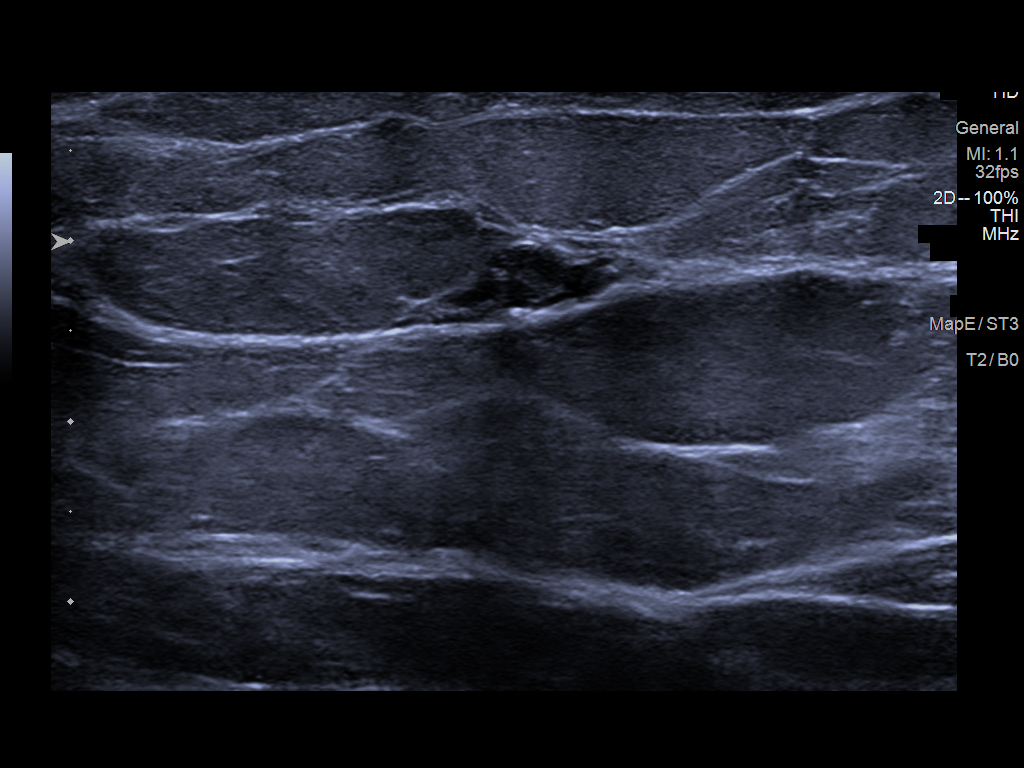
[im 6/7]
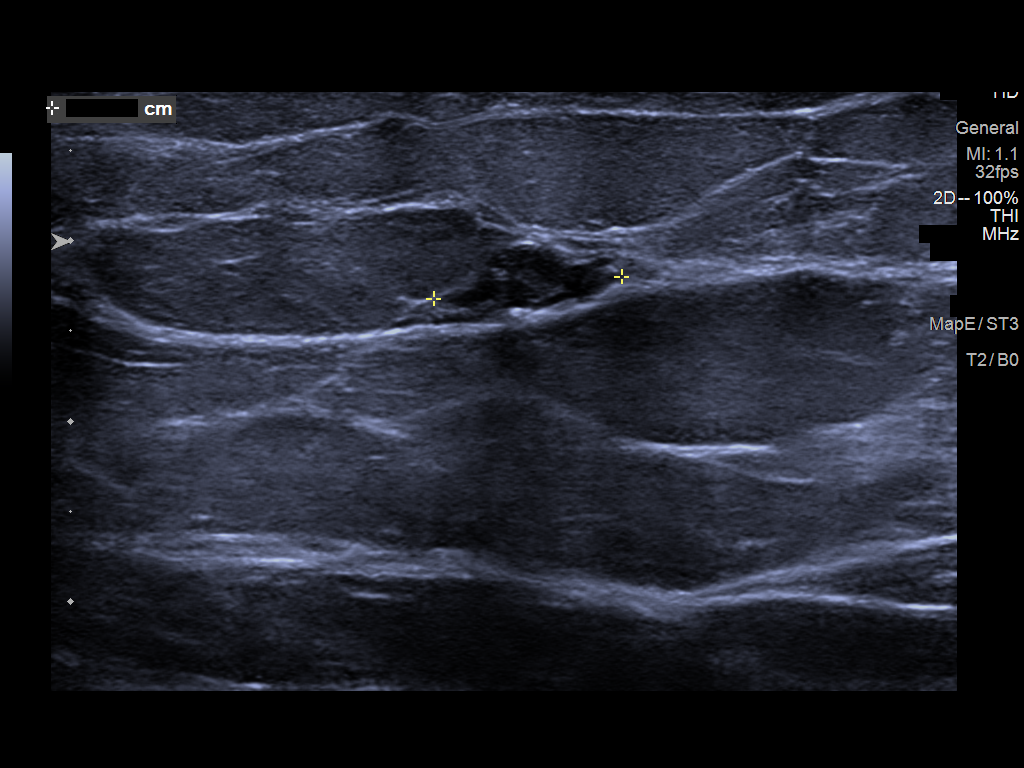
[im 7/7]
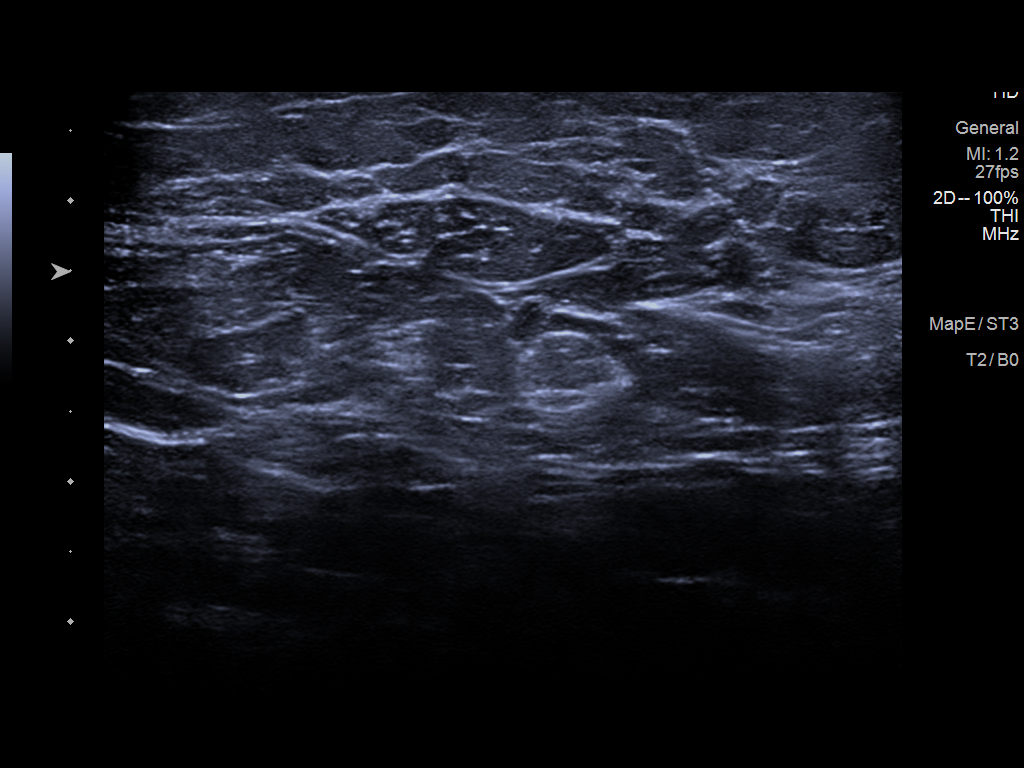

[7 of 7 positions shown; findings below may reference images not displayed]

ACR Breast Density Category b: There are scattered areas of
fibroglandular density.
FINDINGS: Mammogram:

Right breast: Spot compression tomosynthesis cc and MLO as well as
full field mL and rolled cc views of the right breast were
performed. The questioned mass seen on screening mammogram in the
outer right breast persists on the spot cc view though is less
conspicuous on lateral views. This measures approximately 1.1 cm.

Ultrasound:

Targeted ultrasound performed in the outer aspect of the right
breast demonstrating an oval circumscribed anechoic mass with
internal echoes at 9 o'clock 7 cm from the nipple the mass measures
1.6 x 0.4 x 1.1 cm. This corresponds to the mammographic finding. No
internal vascularity.

Targeted ultrasound the right axilla demonstrates multiple normal
lymph nodes.
IMPRESSION: Probably benign mass in the right breast at 9 o'clock, possibly a
complicated cyst/cluster of cysts.

RECOMMENDATION:
Right breast ultrasound in 6 months. Discussed with patient the
options of short-term follow-up versus biopsy. Patient prefers to
proceed with short-term follow-up.

BI-RADS CATEGORY  3: Probably benign.

## 2021-02-05 ENCOUNTER — Other Ambulatory Visit: Payer: Self-pay | Admitting: Internal Medicine

## 2021-02-05 DIAGNOSIS — R928 Other abnormal and inconclusive findings on diagnostic imaging of breast: Secondary | ICD-10-CM

## 2021-02-05 NOTE — Progress Notes (Signed)
Order placed for f/u right breast ultrasound.  

## 2021-02-06 ENCOUNTER — Other Ambulatory Visit: Payer: Self-pay | Admitting: Internal Medicine

## 2021-03-11 ENCOUNTER — Telehealth: Payer: Self-pay | Admitting: Pharmacist

## 2021-03-11 ENCOUNTER — Telehealth: Payer: BC Managed Care – PPO

## 2021-03-11 NOTE — Telephone Encounter (Signed)
  Chronic Care Management   Note  03/11/2021 Name: Cassandra Greene MRN: [id number] DOB: 03/13/1976   Attempted to contact patient for scheduled appointment for medication management support. Left HIPAA compliant message for patient to return my call at their convenience.    Plan: - If I do not hear back from the patient by end of business today, will collaborate with Care Guide to outreach to schedule follow up with me   Catie Feliz Beam, PharmD, [place], CPP Clinical Pharmacist [employer] at [employer] [phone]

## 2021-03-15 ENCOUNTER — Telehealth: Payer: Self-pay

## 2021-03-15 NOTE — Chronic Care Management (AMB) (Signed)
  Care Management   Note  03/15/2021 Name: LAKINA MCINTIRE MRN: [id number] DOB: [birth year]  BLOSSIE RAFFEL is a 45 y.o. year old female who is a primary care patient of Dale [place], MD and is actively engaged with the care management team. I reached out to [name] by phone today to assist with re-scheduling a follow up visit with the Pharmacist  Follow up plan: Unsuccessful telephone outreach attempt made. A HIPAA compliant phone message was left for the patient providing contact information and requesting a return call.  The care management team will reach out to the patient again over the next 7 days.  If patient returns call to provider office, please advise to call Embedded Care Management Care Guide Penne Lash  at [phone]  Penne Lash, RMA Care Guide, Embedded Care Coordination [facility]  [place], [place] [address] Direct Dial: [phone] [email or web address]

## 2021-03-19 ENCOUNTER — Ambulatory Visit: Payer: BC Managed Care – PPO | Admitting: Internal Medicine

## 2021-03-19 ENCOUNTER — Other Ambulatory Visit: Payer: Self-pay

## 2021-03-19 DIAGNOSIS — R002 Palpitations: Secondary | ICD-10-CM

## 2021-03-19 DIAGNOSIS — F439 Reaction to severe stress, unspecified: Secondary | ICD-10-CM | POA: Diagnosis not present

## 2021-03-19 DIAGNOSIS — E7800 Pure hypercholesterolemia, unspecified: Secondary | ICD-10-CM | POA: Diagnosis not present

## 2021-03-19 DIAGNOSIS — I10 Essential (primary) hypertension: Secondary | ICD-10-CM

## 2021-03-19 DIAGNOSIS — Z6831 Body mass index (BMI) 31.0-31.9, adult: Secondary | ICD-10-CM

## 2021-03-19 MED ORDER — CARVEDILOL 6.25 MG PO TABS: 6.2500 mg | ORAL_TABLET | Freq: Two times a day (BID) | ORAL | 3 refills | Status: DC

## 2021-03-19 NOTE — Progress Notes (Signed)
Patient ID: Cassandra Greene, female   DOB: 27-Nov-1975, 45 y.o.   MRN: [id number]   Subjective:    Patient ID: Cassandra Greene, female    DOB: June 27, 1976, 45 y.o.   MRN: [id number]  This visit occurred during the SARS-CoV-2 public health emergency.  Safety protocols were in place, including screening questions prior to the visit, additional usage of staff PPE, and extensive cleaning of exam room while observing appropriate contact time as indicated for disinfecting solutions.   Patient here for a scheduled follow up.   Chief Complaint  Patient presents with   Hypertension   .   HPI Here to follow up regarding her blood pressure.  She is doing better.  Stress is better.  Her son was diagnosed with flu - over two weeks ago. She developed symptoms.  Some residual cough.  Feeling better.  Work stress is better.  No chest pain.  Still notices some palpitations.  Is better with coreg.  Blood pressures 120/89-90.  No abdominal pain.  Bowels moving.  Seeing Dr Dalbert Garnet - IUD - recommended f/u in one year.     Past Medical History:  Diagnosis Date   Allergy    Asthma    Fatigue    GERD (gastroesophageal reflux disease)    Heavy periods    Hyperlipidemia    Painful menstrual periods    Vitamin D deficiency    Yeast infection of the vagina    History reviewed. No pertinent surgical history. Family History  Problem Relation Age of Onset   Thyroid disease Mother    Hypertension Mother    Cancer Mother    Hyperlipidemia Mother    Thyroid disease Father    Hypertension Father    Cancer Father    Hyperlipidemia Father    Hearing loss Father    Thyroid disease Maternal Aunt    Thyroid disease Maternal Grandmother    Hypertension Maternal Grandmother    Heart disease Maternal Grandmother    Thyroid disease Paternal Grandmother    Hypertension Brother    Kidney disease Brother    Alcohol abuse Maternal Grandfather    Stroke Maternal Grandfather    Heart disease Maternal Grandfather     Hearing loss Paternal Grandfather    Hypertension Paternal Grandfather    Hyperlipidemia Paternal Grandfather    Breast cancer Neg Hx    Social History   Socioeconomic History   Marital status: Married    Spouse name: Not on file   Number of children: 3   Years of education: Not on file   Highest education level: Bachelor's degree (e.g., BA, AB, BS)  Occupational History   Not on file  Tobacco Use   Smoking status: Never   Smokeless tobacco: Never  Vaping Use   Vaping Use: Never used  Substance and Sexual Activity   Alcohol use: Yes   Drug use: No   Sexual activity: Yes    Birth control/protection: I.U.D.    Comment: mirena  Other Topics Concern   Not on file  Social History Narrative   Not on file   Social Determinants of Health   Financial Resource Strain: Low Risk    Difficulty of Paying Living Expenses: Not hard at all  Food Insecurity: Not on file  Transportation Needs: Not on file  Physical Activity: Not on file  Stress: Not on file  Social Connections: Not on file     Review of Systems  Constitutional:  Negative for appetite change and unexpected weight  change.  HENT:  Negative for congestion and sinus pressure.   Respiratory:  Negative for cough, chest tightness and shortness of breath.   Cardiovascular:  Negative for chest pain and leg swelling.  Gastrointestinal:  Negative for abdominal pain, diarrhea, nausea and vomiting.  Genitourinary:  Negative for difficulty urinating and dysuria.  Musculoskeletal:  Negative for joint swelling and myalgias.  Skin:  Negative for color change and rash.  Neurological:  Negative for dizziness, light-headedness and headaches.  Psychiatric/Behavioral:  Negative for agitation and dysphoric mood.       Objective:     BP 128/76   Pulse 80   Temp 97.8 F (36.6 C)   Resp 16   Ht 5\' 1"  (1.549 m)   Wt 169 lb 6.4 oz (76.8 kg)   SpO2 99%   BMI 32.01 kg/m  Wt Readings from Last 3 Encounters:  03/19/21 169 lb 6.4 oz  (76.8 kg)  11/26/20 169 lb (76.7 kg)  10/21/20 173 lb 12.8 oz (78.8 kg)    Physical Exam Vitals reviewed.  Constitutional:      General: She is not in acute distress.    Appearance: Normal appearance.  HENT:     Head: Normocephalic and atraumatic.     Right Ear: External ear normal.     Left Ear: External ear normal.  Eyes:     General: No scleral icterus.       Right eye: No discharge.        Left eye: No discharge.     Conjunctiva/sclera: Conjunctivae normal.  Neck:     Thyroid: No thyromegaly.  Cardiovascular:     Rate and Rhythm: Normal rate and regular rhythm.  Pulmonary:     Effort: No respiratory distress.     Breath sounds: Normal breath sounds. No wheezing.  Abdominal:     General: Bowel sounds are normal.     Palpations: Abdomen is soft.     Tenderness: There is no abdominal tenderness.  Musculoskeletal:        General: No swelling or tenderness.     Cervical back: Neck supple. No tenderness.  Lymphadenopathy:     Cervical: No cervical adenopathy.  Skin:    Findings: No erythema or rash.  Neurological:     Mental Status: She is alert.  Psychiatric:        Mood and Affect: Mood normal.        Behavior: Behavior normal.     Outpatient Encounter Medications as of 03/19/2021  Medication Sig   carvedilol (COREG) 6.25 MG tablet Take 1 tablet (6.25 mg total) by mouth 2 (two) times daily with a meal.   ibuprofen (ADVIL,MOTRIN) 200 MG tablet Take 200 mg by mouth every 6 (six) hours as needed.   lansoprazole (PREVACID) 15 MG capsule Take 15 mg by mouth daily.   levonorgestrel (MIRENA) 20 MCG/24HR IUD 1 each by Intrauterine route once.   Vitamin D, Ergocalciferol, (DRISDOL) 1.25 MG (50000 UNIT) CAPS capsule TAKE 1 CAPSULE BY MOUTH TWICE PER WEEK   [DISCONTINUED] ALPRAZolam (XANAX) 0.25 MG tablet Take 1 tablet (0.25 mg total) by mouth daily as needed for anxiety. (Patient not taking: Reported on 01/21/2021)   [DISCONTINUED] carvedilol (COREG) 3.125 MG tablet TAKE 1  TABLET BY MOUTH 2 TIMES DAILY WITH A MEAL   [DISCONTINUED] Semaglutide,0.25 or 0.5MG /DOS, (OZEMPIC, 0.25 OR 0.5 MG/DOSE,) 2 MG/1.5ML SOPN Inject 0.5 mg into the skin once a week.   No facility-administered encounter medications on file as of 03/19/2021.  Lab Results  Component Value Date   WBC 6.4 09/28/2020   HGB 14.5 09/28/2020   HCT 42.9 09/28/2020   PLT 263.0 09/28/2020   GLUCOSE 80 09/28/2020   CHOL 227 (H) 10/02/2019   TRIG 158.0 (H) 10/02/2019   HDL 48.30 10/02/2019   LDLCALC 148 (H) 10/02/2019   ALT 20 09/28/2020   AST 20 09/28/2020   NA 137 09/28/2020   K 4.1 09/28/2020   CL 104 09/28/2020   CREATININE 0.71 09/28/2020   BUN 21 09/28/2020   CO2 25 09/28/2020   TSH 2.76 09/28/2020   HGBA1C 5.2 08/30/2018    US BREAST LTD UNI RIGHT INC AXILLA  Result Date: 02/04/2021 CLINICAL DATA:  44 year old female presenting as a recall from screening for possible right breast mass. EXAM: DIGITAL DIAGNOSTIC UNILATERAL RIGHT MAMMOGRAM WITH TOMOSYNTHESIS AND CAD; ULTRASOUND RIGHT BREAST LIMITED TECHNIQUE: Right digital diagnostic mammography and breast tomosynthesis was performed. The images were evaluated with computer-aided detection.; Targeted ultrasound examination of the right breast was performed COMPARISON:  Previous exam(s). ACR Breast Density Category b: There are scattered areas of fibroglandular density. FINDINGS: Mammogram: Right breast: Spot compression tomosynthesis cc and MLO as well as full field mL and rolled cc views of the right breast were performed. The questioned mass seen on screening mammogram in the outer right breast persists on the spot cc view though is less conspicuous on lateral views. This measures approximately 1.1 cm. Ultrasound: Targeted ultrasound performed in the outer aspect of the right breast demonstrating an oval circumscribed anechoic mass with internal echoes at 9 o'clock 7 cm from the nipple the mass measures 1.6 x 0.4 x 1.1 cm. This corresponds to  the mammographic finding. No internal vascularity. Targeted ultrasound the right axilla demonstrates multiple normal lymph nodes. IMPRESSION: Probably benign mass in the right breast at 9 o'clock, possibly a complicated cyst/cluster of cysts. RECOMMENDATION: Right breast ultrasound in 6 months. Discussed with patient the options of short-term follow-up versus biopsy. Patient prefers to proceed with short-term follow-up. BI-RADS CATEGORY  3: Probably benign. Electronically Signed   By: Emmaline Kluver M.D.   On: 02/04/2021 13:06  MM DIAG BREAST TOMO UNI RIGHT  Result Date: 02/04/2021 CLINICAL DATA:  45 year old female presenting as a recall from screening for possible right breast mass. EXAM: DIGITAL DIAGNOSTIC UNILATERAL RIGHT MAMMOGRAM WITH TOMOSYNTHESIS AND CAD; ULTRASOUND RIGHT BREAST LIMITED TECHNIQUE: Right digital diagnostic mammography and breast tomosynthesis was performed. The images were evaluated with computer-aided detection.; Targeted ultrasound examination of the right breast was performed COMPARISON:  Previous exam(s). ACR Breast Density Category b: There are scattered areas of fibroglandular density. FINDINGS: Mammogram: Right breast: Spot compression tomosynthesis cc and MLO as well as full field mL and rolled cc views of the right breast were performed. The questioned mass seen on screening mammogram in the outer right breast persists on the spot cc view though is less conspicuous on lateral views. This measures approximately 1.1 cm. Ultrasound: Targeted ultrasound performed in the outer aspect of the right breast demonstrating an oval circumscribed anechoic mass with internal echoes at 9 o'clock 7 cm from the nipple the mass measures 1.6 x 0.4 x 1.1 cm. This corresponds to the mammographic finding. No internal vascularity. Targeted ultrasound the right axilla demonstrates multiple normal lymph nodes. IMPRESSION: Probably benign mass in the right breast at 9 o'clock, possibly a complicated  cyst/cluster of cysts. RECOMMENDATION: Right breast ultrasound in 6 months. Discussed with patient the options of short-term follow-up versus biopsy. Patient prefers to proceed  with short-term follow-up. BI-RADS CATEGORY  3: Probably benign. Electronically Signed   By: Emmaline Kluver M.D.   On: 02/04/2021 13:06      Assessment & Plan:   Problem List Items Addressed This Visit     BMI 31.0-31.9,adult    She had initially expressed interest in trial of wegovy to help with weight loss.  Discussed today.  Wants to hold on starting medication.  Follow.  Low carb diet and exercise.        Hypercholesterolemia    Low cholesterol diet and exercise.  Follow lipid panel.       Relevant Medications   carvedilol (COREG) 6.25 MG tablet   Hypertension, essential    On carvedilol.  Blood pressure remains elevated.  Is feeling better, but still some occasional symptoms.  Increase coreg to 6.25 bid.  Follow pressures.       Relevant Medications   carvedilol (COREG) 6.25 MG tablet   Palpitations    On carvedilol.  Better.  Still some persistent symptoms.  Increase carvedilol to 6.25 bid.  Follow.       Stress    Overall appears to be doing better.  Feeling better.  Follow.         Dale Zuehl, MD

## 2021-03-22 NOTE — Chronic Care Management (AMB) (Signed)
  Care Management   Note  03/22/2021 Name: ZAHRA PEFFLEY MRN: [id number] DOB: Dec 06, 1975  ADA HOLNESS is a 45 y.o. year old female who is a primary care patient of Dale Wrightwood, MD and is actively engaged with the care management team. I reached out to [name] by phone today to assist with re-scheduling a follow up visit with the Pharmacist  Follow up plan: Unsuccessful telephone outreach attempt made. A HIPAA compliant phone message was left for the patient providing contact information and requesting a return call.  The care management team will reach out to the patient again over the next 7 days.  If patient returns call to provider office, please advise to call Embedded Care Management Care Guide Penne Lash  at [phone]  Penne Lash, RMA Care Guide, Embedded Care Coordination [facility]  [place], [place] [address] Direct Dial: [phone] [email or web address] Website: [email or web address]

## 2021-03-27 ENCOUNTER — Encounter: Payer: Self-pay | Admitting: Internal Medicine

## 2021-03-27 NOTE — Assessment & Plan Note (Signed)
Low cholesterol diet and exercise.  Follow lipid panel.   

## 2021-03-27 NOTE — Assessment & Plan Note (Signed)
On carvedilol.  Better.  Still some persistent symptoms.  Increase carvedilol to 6.25 bid.  Follow.

## 2021-03-27 NOTE — Assessment & Plan Note (Signed)
On carvedilol.  Blood pressure remains elevated.  Is feeling better, but still some occasional symptoms.  Increase coreg to 6.25 bid.  Follow pressures.

## 2021-03-27 NOTE — Assessment & Plan Note (Signed)
Overall appears to be doing better.  Feeling better.  Follow.

## 2021-03-27 NOTE — Assessment & Plan Note (Signed)
She had initially expressed interest in trial of wegovy to help with weight loss.  Discussed today.  Wants to hold on starting medication.  Follow.  Low carb diet and exercise.

## 2021-04-09 ENCOUNTER — Telehealth: Payer: Self-pay | Admitting: Internal Medicine

## 2021-04-09 NOTE — Telephone Encounter (Signed)
See other result note that I sent to you.  Also, instead of delsym, if having a lot of congestion, can do robitussin DM.  (This has the delsym and guaifenasin).  If acute symptoms, needs to be seen. Also if persistent symptoms , needs to be evaluated.

## 2021-04-09 NOTE — Telephone Encounter (Signed)
Patient has been called and referred to urgent care. Please see other message.

## 2021-04-09 NOTE — Chronic Care Management (AMB) (Signed)
  Care Management   Note  04/09/2021 Name: Cassandra Greene MRN: [id number] DOB: 1976-02-10  Cassandra Greene is a 45 y.o. year old female who is a primary care patient of Dale Marcus, MD and is actively engaged with the care management team. I reached out to Cassandra Greene by phone today to assist with re-scheduling a follow up visit with the Pharmacist  Follow up plan: Patient declines further follow up and engagement by the care management team. Appropriate care team members and provider have been notified via electronic communication.   Penne Lash, RMA Care Guide, Embedded Care Coordination [facility]  [place], [place] [address] Direct Dial: [phone] [email or web address]: [email or web address]

## 2021-04-09 NOTE — Telephone Encounter (Signed)
Called to speak with Cassandra Greene. Cassandra Greene states that her entire family has the cough and they were all evaluated and received antibiotics. She states that she has not been evaluated and was hoping that Dr. Lorin Picket would send in an antibiotic. Pt was informed that antibiotics needed a visit in order to prescribe and that if she needs an antibiotic at this time, she would have to be evaluated. Explained the mychart virtual urgent care option and the times of the [facility] urgent care. Cassandra Greene verbalized understanding and had no further questions.

## 2021-04-09 NOTE — Telephone Encounter (Signed)
FYI to provider. Were you aware of the patients cough?

## 2021-04-09 NOTE — Telephone Encounter (Signed)
Providing access nurse documentation.      

## 2021-04-09 NOTE — Telephone Encounter (Signed)
Reviewed this note and other note from today.  Per note - having post nasal drainage and cough.  Confirm no sob, chest pain or chest tightness.  If able to use nasacort nasal spray (and no allergy) - then nasacort nasal spray - 2 sprays each nostril one time per day.  Do this in the evening.  Also can try delsym cough syrup.  If any acute symptoms (such as sob, chest tightness or chest pain) needs to be evaluated now.  If no acute symptoms, can try above, but will need to be evaluated if symptoms persists.  Also, confirm no acid reflux.

## 2021-04-09 NOTE — Telephone Encounter (Signed)
Pt declined rescheduling at this time

## 2021-04-09 NOTE — Telephone Encounter (Signed)
Patient has been seen by Dr Lorin Picket on 03/18/2021. Patient states at the time of her appointment she had a cough. Cough has become more productive and she has post-nasal drip. Patient already spoke to Access Nurse and was transferred to office. Phone originally started with Access Nurse, office did not transfer phone call. Front office is not sure how patient's phone call went to AcessNurse first.

## 2021-04-09 NOTE — Telephone Encounter (Signed)
Patient informed, Due to the high volume of calls and your symptoms we have to forward your call to our Triage Nurse to expedient your call. Please hold for the transfer.  Patient transferred to Access Nurse. Due to having a cough for the past 7 weeks and wondering if Dr.Scott can send something in for her.No openings in office or virtual for the patient to be evaluated.

## 2021-06-17 ENCOUNTER — Ambulatory Visit: Payer: BC Managed Care – PPO | Admitting: Internal Medicine

## 2021-06-30 ENCOUNTER — Other Ambulatory Visit: Payer: Self-pay | Admitting: Internal Medicine

## 2021-07-07 ENCOUNTER — Telehealth: Payer: Self-pay | Admitting: Internal Medicine

## 2021-07-07 NOTE — Telephone Encounter (Signed)
Lft pt vm to call [facility] to sch [place] f/u appt due 08/07/2021. thanks

## 2021-08-02 ENCOUNTER — Encounter: Payer: Self-pay | Admitting: Internal Medicine

## 2021-08-02 ENCOUNTER — Other Ambulatory Visit: Payer: Self-pay

## 2021-08-02 ENCOUNTER — Ambulatory Visit (INDEPENDENT_AMBULATORY_CARE_PROVIDER_SITE_OTHER): Payer: BC Managed Care – PPO | Admitting: Internal Medicine

## 2021-08-02 VITALS — BP 128/74 | HR 77 | Temp 97.6°F | Resp 16 | Ht 62.0 in | Wt 175.6 lb

## 2021-08-02 DIAGNOSIS — Z1231 Encounter for screening mammogram for malignant neoplasm of breast: Secondary | ICD-10-CM

## 2021-08-02 DIAGNOSIS — R002 Palpitations: Secondary | ICD-10-CM | POA: Diagnosis not present

## 2021-08-02 DIAGNOSIS — F439 Reaction to severe stress, unspecified: Secondary | ICD-10-CM | POA: Diagnosis not present

## 2021-08-02 DIAGNOSIS — R519 Headache, unspecified: Secondary | ICD-10-CM

## 2021-08-02 DIAGNOSIS — I10 Essential (primary) hypertension: Secondary | ICD-10-CM | POA: Diagnosis not present

## 2021-08-02 DIAGNOSIS — E7800 Pure hypercholesterolemia, unspecified: Secondary | ICD-10-CM | POA: Diagnosis not present

## 2021-08-02 DIAGNOSIS — E559 Vitamin D deficiency, unspecified: Secondary | ICD-10-CM

## 2021-08-02 DIAGNOSIS — Z124 Encounter for screening for malignant neoplasm of cervix: Secondary | ICD-10-CM

## 2021-08-02 LAB — LIPID PANEL
Cholesterol: 232 mg/dL — ABNORMAL HIGH (ref 0–200)
HDL: 43.2 mg/dL (ref >39.00)
LDL Cholesterol: 160 mg/dL — ABNORMAL HIGH (ref 0–99)
NonHDL: 188.38
Total CHOL/HDL Ratio: 5
Triglycerides: 142 mg/dL (ref 0.0–149.0)
VLDL: 28.4 mg/dL (ref 0.0–40.0)

## 2021-08-02 LAB — COMPREHENSIVE METABOLIC PANEL
ALT: 14 U/L (ref 0–35)
AST: 14 U/L (ref 0–37)
Albumin: 4.5 g/dL (ref 3.5–5.2)
Alkaline Phosphatase: 43 U/L (ref 39–117)
BUN: 22 mg/dL (ref 6–23)
CO2: 27 mEq/L (ref 19–32)
Calcium: 9.3 mg/dL (ref 8.4–10.5)
Chloride: 105 mEq/L (ref 96–112)
Creatinine, Ser: 0.71 mg/dL (ref 0.40–1.20)
GFR: 103.16 mL/min (ref >60.00)
Glucose, Bld: 92 mg/dL (ref 70–99)
Potassium: 4.4 mEq/L (ref 3.5–5.1)
Sodium: 137 mEq/L (ref 135–145)
Total Bilirubin: 1.1 mg/dL (ref 0.2–1.2)
Total Protein: 7 g/dL (ref 6.0–8.3)

## 2021-08-02 LAB — VITAMIN D 25 HYDROXY (VIT D DEFICIENCY, FRACTURES): VITD: 26.35 ng/mL — ABNORMAL LOW (ref 30.00–100.00)

## 2021-08-02 MED ORDER — MAGNESIUM OXIDE -MG SUPPLEMENT 400 (240 MG) MG PO TABS: 400.0000 mg | ORAL_TABLET | Freq: Every day | ORAL | 2 refills | Status: DC

## 2021-08-02 NOTE — Assessment & Plan Note (Addendum)
On carvedilol.  Blood pressure as outlined.  Still elevated above goal.  Discussed diet and exercise.  She is back exercising.  Hold on changing dose of coreg or adding medication.  Follow pressures and pulse. Send in readings over the next several weeks.  Follow metabolic panel.

## 2021-08-02 NOTE — Assessment & Plan Note (Signed)
Continues f/u Dr Beasley.   

## 2021-08-02 NOTE — Progress Notes (Signed)
Patient ID: Cassandra Greene, female   DOB: 08-05-1975, 46 y.o.   MRN: [id number]   Subjective:    Patient ID: Cassandra Greene, female    DOB: May 26, 1976, 46 y.o.   MRN: [id number]  This visit occurred during the SARS-CoV-2 public health emergency.  Safety protocols were in place, including screening questions prior to the visit, additional usage of staff PPE, and extensive cleaning of exam room while observing appropriate contact time as indicated for disinfecting solutions.   Patient here for a scheduled follow up.   Chief Complaint  Patient presents with   Hypertension   .   HPI Here to follow up regarding her blood pressure.  Blood pressures have been doing better overall.  Diastolics still elevated (mid 80s -90s). Increased stress - work.  Discussed.  No chest pain or sob reported.  Still with palpitations.  No abdominal pain or bowel change reported.  Sees Dr Dalbert Garnet.  Has IUD.  Has a history of migraine headaches.  Has noticed more frequent recently.  Had 3 in the last several weeks.  Typical migraines.  No unusual symptoms.  She is going to the gym.  Reports increased pain - left knee.  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  Discussed orth/PT referral.  Wants to hold.     Past Medical History:  Diagnosis Date   Allergy    Asthma    Fatigue    GERD (gastroesophageal reflux disease)    Heavy periods    Hyperlipidemia    Painful menstrual periods    Vitamin D deficiency    Yeast infection of the vagina    History reviewed. No pertinent surgical history. Family History  Problem Relation Age of Onset   Thyroid disease Mother    Hypertension Mother    Cancer Mother    Hyperlipidemia Mother    Thyroid disease Father    Hypertension Father    Cancer Father    Hyperlipidemia Father    Hearing loss Father    Thyroid disease Maternal Aunt    Thyroid disease Maternal Grandmother    Hypertension Maternal Grandmother    Heart disease Maternal Grandmother    Thyroid disease  Paternal Grandmother    Hypertension Brother    Kidney disease Brother    Alcohol abuse Maternal Grandfather    Stroke Maternal Grandfather    Heart disease Maternal Grandfather    Hearing loss Paternal Grandfather    Hypertension Paternal Grandfather    Hyperlipidemia Paternal Grandfather    Breast cancer Neg Hx    Social History   Socioeconomic History   Marital status: Married    Spouse name: Not on file   Number of children: 3   Years of education: Not on file   Highest education level: Bachelor's degree (e.g., BA, AB, BS)  Occupational History   Not on file  Tobacco Use   Smoking status: Never   Smokeless tobacco: Never  Vaping Use   Vaping Use: Never used  Substance and Sexual Activity   Alcohol use: Yes   Drug use: No   Sexual activity: Yes    Birth control/protection: I.U.D.    Comment: mirena  Other Topics Concern   Not on file  Social History Narrative   Not on file   Social Determinants of Health   Financial Resource Strain: Low Risk    Difficulty of Paying Living Expenses: Not hard at all  Food Insecurity: Not on file  Transportation Needs: Not on file  Physical Activity: Not on file  Stress: Not on file  Social Connections: Not on file     Review of Systems  Constitutional:  Negative for appetite change and unexpected weight change.  HENT:  Negative for congestion and sinus pressure.   Respiratory:  Negative for cough, chest tightness and shortness of breath.   Cardiovascular:  Positive for palpitations. Negative for chest pain and leg swelling.  Gastrointestinal:  Negative for abdominal pain, diarrhea, nausea and vomiting.  Genitourinary:  Negative for difficulty urinating and dysuria.  Musculoskeletal:  Negative for myalgias.       Left knee pain as outlined.    Skin:  Negative for color change and rash.  Neurological:  Negative for dizziness, light-headedness and headaches.  Psychiatric/Behavioral:  Negative for agitation and dysphoric mood.        Objective:     BP 128/74    Pulse 77    Temp 97.6 F (36.4 C)    Resp 16    Ht 5\' 2"  (1.575 m)    Wt 175 lb 9.6 oz (79.7 kg)    SpO2 98%    BMI 32.12 kg/m  Wt Readings from Last 3 Encounters:  08/02/21 175 lb 9.6 oz (79.7 kg)  03/19/21 169 lb 6.4 oz (76.8 kg)  11/26/20 169 lb (76.7 kg)    Physical Exam Vitals reviewed.  Constitutional:      General: She is not in acute distress.    Appearance: Normal appearance.  HENT:     Head: Normocephalic and atraumatic.     Right Ear: External ear normal.     Left Ear: External ear normal.  Eyes:     General: No scleral icterus.       Right eye: No discharge.        Left eye: No discharge.     Conjunctiva/sclera: Conjunctivae normal.  Neck:     Thyroid: No thyromegaly.  Cardiovascular:     Rate and Rhythm: Normal rate and regular rhythm.  Pulmonary:     Effort: No respiratory distress.     Breath sounds: Normal breath sounds. No wheezing.  Abdominal:     General: Bowel sounds are normal.     Palpations: Abdomen is soft.     Tenderness: There is no abdominal tenderness.  Musculoskeletal:        General: No swelling or tenderness.     Cervical back: Neck supple. No tenderness.  Lymphadenopathy:     Cervical: No cervical adenopathy.  Skin:    Findings: No erythema or rash.  Neurological:     Mental Status: She is alert.  Psychiatric:        Mood and Affect: Mood normal.        Behavior: Behavior normal.     Outpatient Encounter Medications as of 08/02/2021  Medication Sig   carvedilol (COREG) 6.25 MG tablet TAKE ONE (1) TABLET BY MOUTH TWO TIMES PER DAY WITH A MEAL   ibuprofen (ADVIL,MOTRIN) 200 MG tablet Take 200 mg by mouth every 6 (six) hours as needed.   lansoprazole (PREVACID) 15 MG capsule Take 15 mg by mouth daily.   levonorgestrel (MIRENA) 20 MCG/24HR IUD 1 each by Intrauterine route once.   magnesium oxide (MAG-OX) 400 (240 Mg) MG tablet Take 1 tablet (400 mg total) by mouth daily.   Vitamin D,  Ergocalciferol, (DRISDOL) 1.25 MG (50000 UNIT) CAPS capsule TAKE 1 CAPSULE BY MOUTH TWICE PER WEEK   No facility-administered encounter medications on file as of 08/02/2021.  Lab Results  Component Value Date   WBC 6.4 09/28/2020   HGB 14.5 09/28/2020   HCT 42.9 09/28/2020   PLT 263.0 09/28/2020   GLUCOSE 92 08/02/2021   CHOL 232 (H) 08/02/2021   TRIG 142.0 08/02/2021   HDL 43.20 08/02/2021   LDLCALC 160 (H) 08/02/2021   ALT 14 08/02/2021   AST 14 08/02/2021   NA 137 08/02/2021   K 4.4 08/02/2021   CL 105 08/02/2021   CREATININE 0.71 08/02/2021   BUN 22 08/02/2021   CO2 27 08/02/2021   TSH 2.76 09/28/2020   HGBA1C 5.2 08/30/2018    US BREAST LTD UNI RIGHT INC AXILLA  Result Date: 02/04/2021 CLINICAL DATA:  46 year old female presenting as a recall from screening for possible right breast mass. EXAM: DIGITAL DIAGNOSTIC UNILATERAL RIGHT MAMMOGRAM WITH TOMOSYNTHESIS AND CAD; ULTRASOUND RIGHT BREAST LIMITED TECHNIQUE: Right digital diagnostic mammography and breast tomosynthesis was performed. The images were evaluated with computer-aided detection.; Targeted ultrasound examination of the right breast was performed COMPARISON:  Previous exam(s). ACR Breast Density Category b: There are scattered areas of fibroglandular density. FINDINGS: Mammogram: Right breast: Spot compression tomosynthesis cc and MLO as well as full field mL and rolled cc views of the right breast were performed. The questioned mass seen on screening mammogram in the outer right breast persists on the spot cc view though is less conspicuous on lateral views. This measures approximately 1.1 cm. Ultrasound: Targeted ultrasound performed in the outer aspect of the right breast demonstrating an oval circumscribed anechoic mass with internal echoes at 9 o'clock 7 cm from the nipple the mass measures 1.6 x 0.4 x 1.1 cm. This corresponds to the mammographic finding. No internal vascularity. Targeted ultrasound the right axilla  demonstrates multiple normal lymph nodes. IMPRESSION: Probably benign mass in the right breast at 9 o'clock, possibly a complicated cyst/cluster of cysts. RECOMMENDATION: Right breast ultrasound in 6 months. Discussed with patient the options of short-term follow-up versus biopsy. Patient prefers to proceed with short-term follow-up. BI-RADS CATEGORY  3: Probably benign. Electronically Signed   By: Emmaline KluverNancy  Ballantyne M.D.   On: 02/04/2021 13:06  MM DIAG BREAST TOMO UNI RIGHT  Result Date: 02/04/2021 CLINICAL DATA:  46 year old female presenting as a recall from screening for possible right breast mass. EXAM: DIGITAL DIAGNOSTIC UNILATERAL RIGHT MAMMOGRAM WITH TOMOSYNTHESIS AND CAD; ULTRASOUND RIGHT BREAST LIMITED TECHNIQUE: Right digital diagnostic mammography and breast tomosynthesis was performed. The images were evaluated with computer-aided detection.; Targeted ultrasound examination of the right breast was performed COMPARISON:  Previous exam(s). ACR Breast Density Category b: There are scattered areas of fibroglandular density. FINDINGS: Mammogram: Right breast: Spot compression tomosynthesis cc and MLO as well as full field mL and rolled cc views of the right breast were performed. The questioned mass seen on screening mammogram in the outer right breast persists on the spot cc view though is less conspicuous on lateral views. This measures approximately 1.1 cm. Ultrasound: Targeted ultrasound performed in the outer aspect of the right breast demonstrating an oval circumscribed anechoic mass with internal echoes at 9 o'clock 7 cm from the nipple the mass measures 1.6 x 0.4 x 1.1 cm. This corresponds to the mammographic finding. No internal vascularity. Targeted ultrasound the right axilla demonstrates multiple normal lymph nodes. IMPRESSION: Probably benign mass in the right breast at 9 o'clock, possibly a complicated cyst/cluster of cysts. RECOMMENDATION: Right breast ultrasound in 6 months. Discussed with  patient the options of short-term follow-up versus biopsy. Patient prefers to proceed with  short-term follow-up. BI-RADS CATEGORY  3: Probably benign. Electronically Signed   By: Emmaline Kluver M.D.   On: 02/04/2021 13:06      Assessment & Plan:   Problem List Items Addressed This Visit     Breast cancer screening    Recent mammogram recommended f/u right breast mammogram - performed 02/15/21.  Recommended right breast ultrasound in 6 months.  Being followed by Dr Dalbert Garnet.  She will schedule.        Cervical cancer screening    Continues f/u Dr Dalbert Garnet.        Headache    Has a history of migraine headaches.  Recent headaches feel similar to her typical migraines.  Discussed stress.  Will monitor blood pressure.  Start mag oxide as directed.  Follow.       Hypercholesterolemia    The 10-year ASCVD risk score (Arnett DK, et al., 2019) is: 1.6%   Values used to calculate the score:     Age: 52 years     Sex: Female     Is Non-Hispanic African American: No     Diabetic: No     Tobacco smoker: No     Systolic Blood Pressure: 128 mmHg     Is BP treated: Yes     HDL Cholesterol: 48.3 mg/dL     Total Cholesterol: 227 mg/dL  Low cholesterol diet and exercise.  Follow lipid panel.       Relevant Orders   Comprehensive metabolic panel (Completed)   Lipid panel (Completed)   Hypertension, essential    On carvedilol.  Blood pressure as outlined.  Still elevated above goal.  Discussed diet and exercise.  She is back exercising.  Hold on changing dose of coreg or adding medication.  Follow pressures and pulse. Send in readings over the next several weeks.  Follow metabolic panel.       Palpitations    Doing better overall on carvedilol.  Still notices.  Discussed possibly increasing carvedilol.  Will monitor heart rate and blood pressure.  Send in readings.  Start mag oxide.  Follow.       Stress    Increased stress.  Discussed.  Overall appears to be handling things relatively  well.  Notify me if feels needs any further intervention.  Follow.        Vitamin D deficiency - Primary    Recheck vitamin D level today.       Relevant Orders   VITAMIN D 25 Hydroxy (Vit-D Deficiency, Fractures) (Completed)     Dale [employer], MD

## 2021-08-02 NOTE — Assessment & Plan Note (Addendum)
Doing better overall on carvedilol.  Still notices.  Discussed possibly increasing carvedilol.  Will monitor heart rate and blood pressure.  Send in readings.  Start mag oxide.  Follow.

## 2021-08-02 NOTE — Assessment & Plan Note (Addendum)
Increased stress.  Discussed.  Overall appears to be handling things relatively well.  Notify me if feels needs any further intervention.  Follow.

## 2021-08-02 NOTE — Assessment & Plan Note (Signed)
The 10-year ASCVD risk score (Arnett DK, et al., 2019) is: 1.6%   Values used to calculate the score:     Age: 46 years     Sex: Female     Is Non-Hispanic African American: No     Diabetic: No     Tobacco smoker: No     Systolic Blood Pressure: 128 mmHg     Is BP treated: Yes     HDL Cholesterol: 48.3 mg/dL     Total Cholesterol: 227 mg/dL  Low cholesterol diet and exercise.  Follow lipid panel.

## 2021-08-03 ENCOUNTER — Telehealth: Payer: Self-pay | Admitting: Internal Medicine

## 2021-08-03 ENCOUNTER — Encounter: Payer: Self-pay | Admitting: Internal Medicine

## 2021-08-03 DIAGNOSIS — R519 Headache, unspecified: Secondary | ICD-10-CM | POA: Insufficient documentation

## 2021-08-03 NOTE — Assessment & Plan Note (Signed)
Recheck vitamin  D level today.  

## 2021-08-03 NOTE — Telephone Encounter (Signed)
My chart message sent to [facility] for update

## 2021-08-03 NOTE — Assessment & Plan Note (Signed)
Recent mammogram recommended f/u right breast mammogram - performed 02/15/21.  Recommended right breast ultrasound in 6 months.  Being followed by Dr Leafy Ro.  She will schedule.

## 2021-08-03 NOTE — Assessment & Plan Note (Signed)
Has a history of migraine headaches.  Recent headaches feel similar to her typical migraines.  Discussed stress.  Will monitor blood pressure.  Start mag oxide as directed.  Follow.

## 2021-08-04 ENCOUNTER — Telehealth: Payer: Self-pay

## 2021-08-04 NOTE — Telephone Encounter (Signed)
-----   Message from Dale [place], MD sent at 08/03/2021  1:02 PM EST ----- Given that she is having problems taking the vitamin D, what may be easier is to take a daily otc dose.  Given her level, have her start vitamin D3 1000 or 2000 units per day instead of the prescription vitamin D.

## 2021-08-04 NOTE — Telephone Encounter (Signed)
lmtcb

## 2021-08-19 ENCOUNTER — Other Ambulatory Visit: Payer: Self-pay

## 2021-08-19 ENCOUNTER — Ambulatory Visit
Admission: RE | Admit: 2021-08-19 | Discharge: 2021-08-19 | Disposition: A | Discharge status: Home / Self Care | Payer: BC Managed Care – PPO | Source: Ambulatory Visit | Attending: Internal Medicine | Admitting: Internal Medicine

## 2021-08-19 DIAGNOSIS — R928 Other abnormal and inconclusive findings on diagnostic imaging of breast: Secondary | ICD-10-CM | POA: Diagnosis present

## 2021-08-19 IMAGING — US US BREAST*R* LIMITED INC AXILLA
1 series · 6 of 6 positions shown · non-contrast
Comparison: Previous exam(s).

CLINICAL DATA: 45-year-old female presenting for first six-month
follow-up of a probably benign right breast mass.

EXAM:
ULTRASOUND OF THE RIGHT BREAST

[Series 1: us breast*right* limited inc axilla · 0.06mm/px · 6 of 6 slices shown]
[im 1/6]
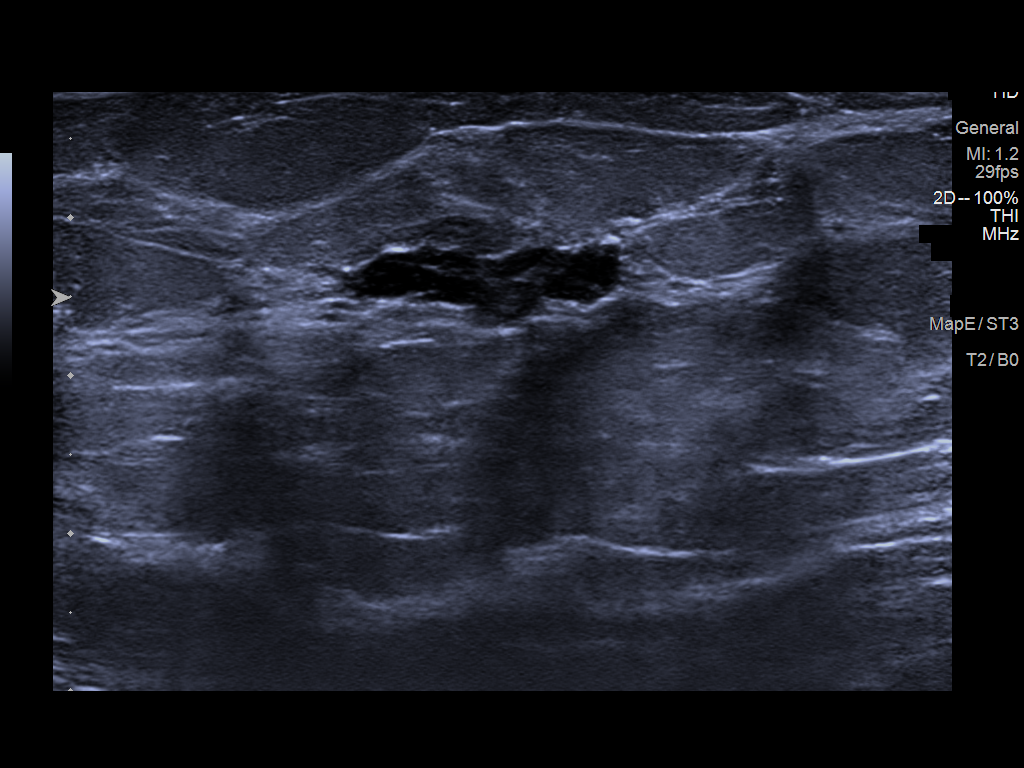
[im 2/6]
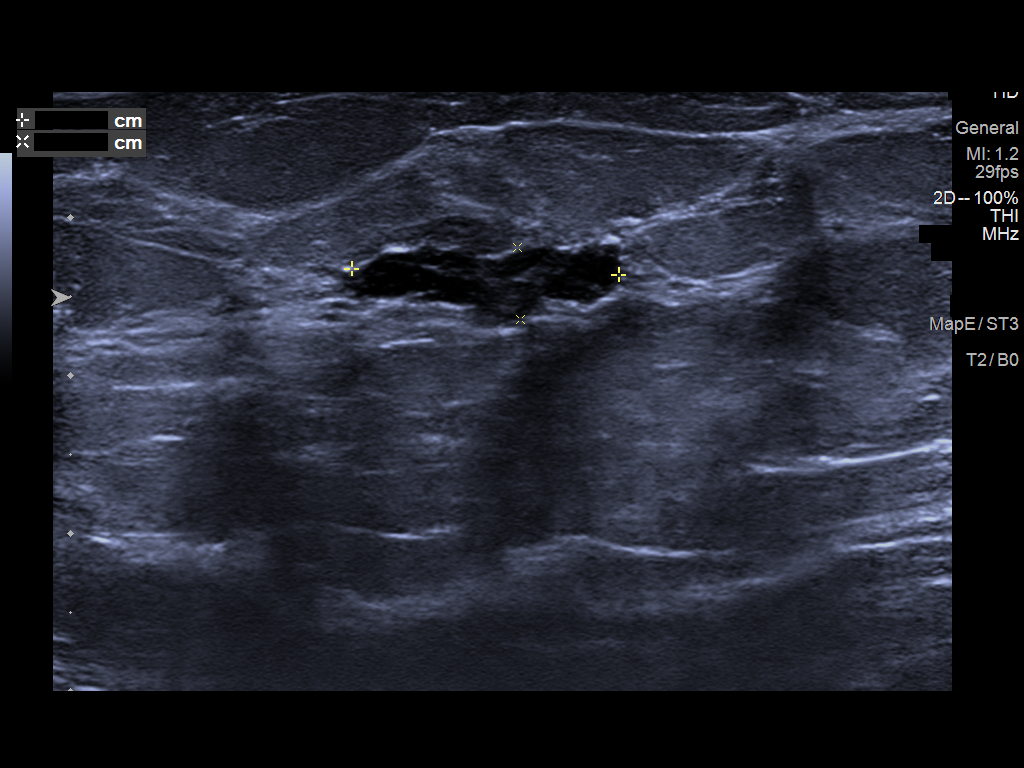
[im 3/6]
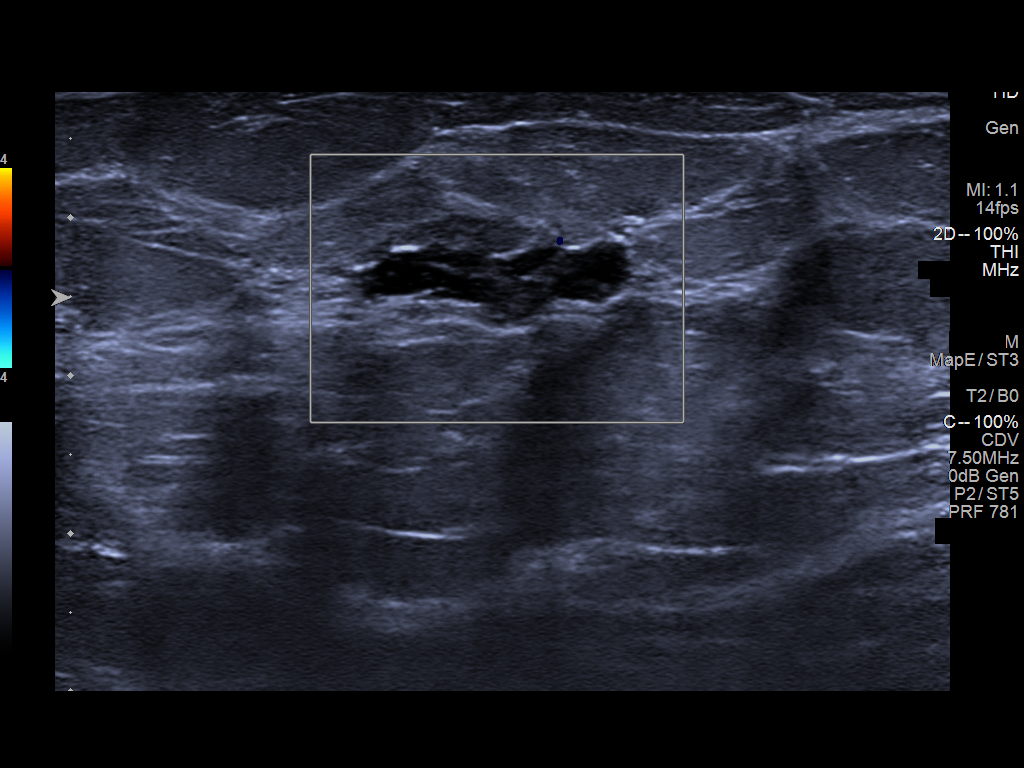
[im 4/6]
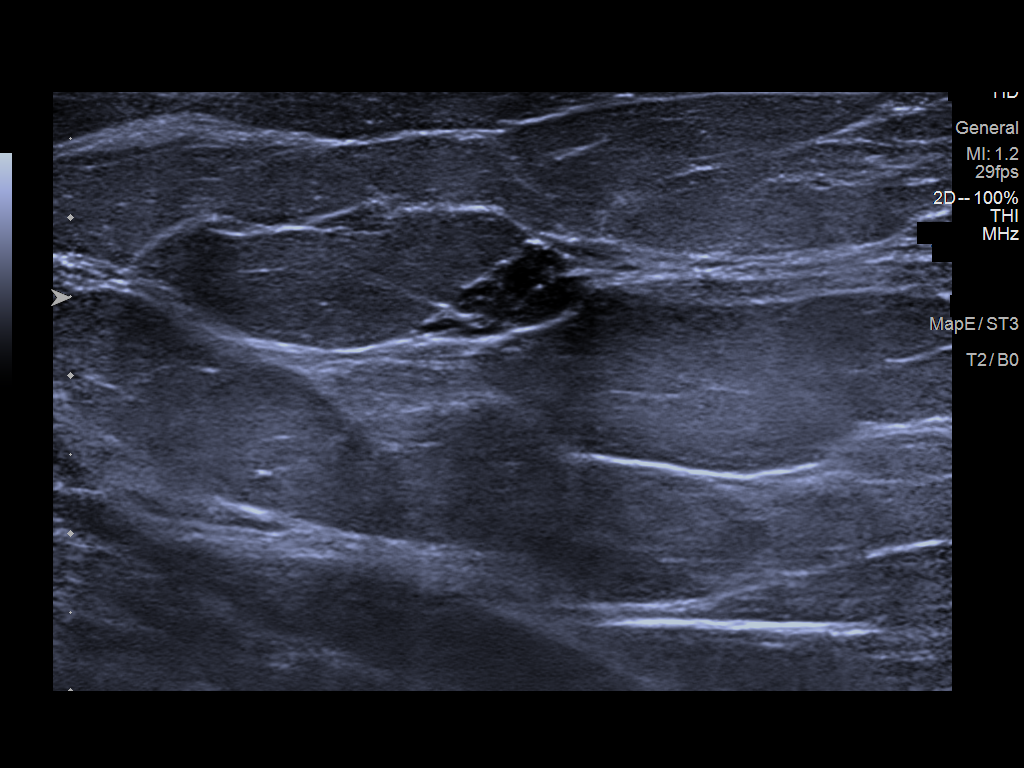
[im 5/6]
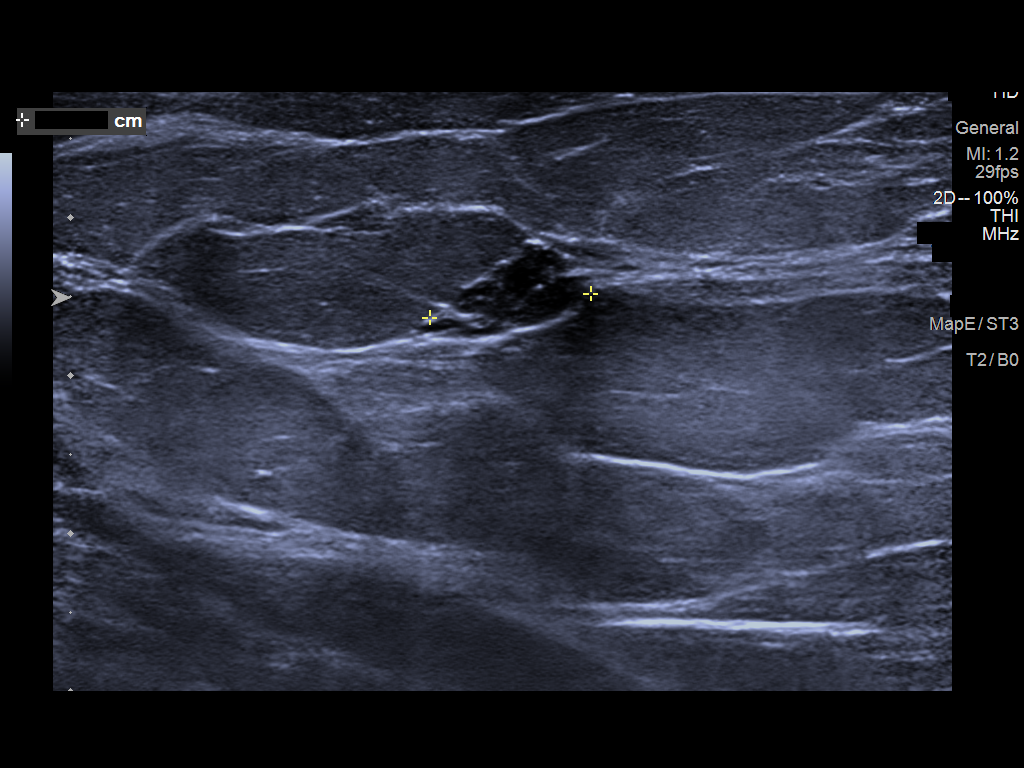
[im 6/6]
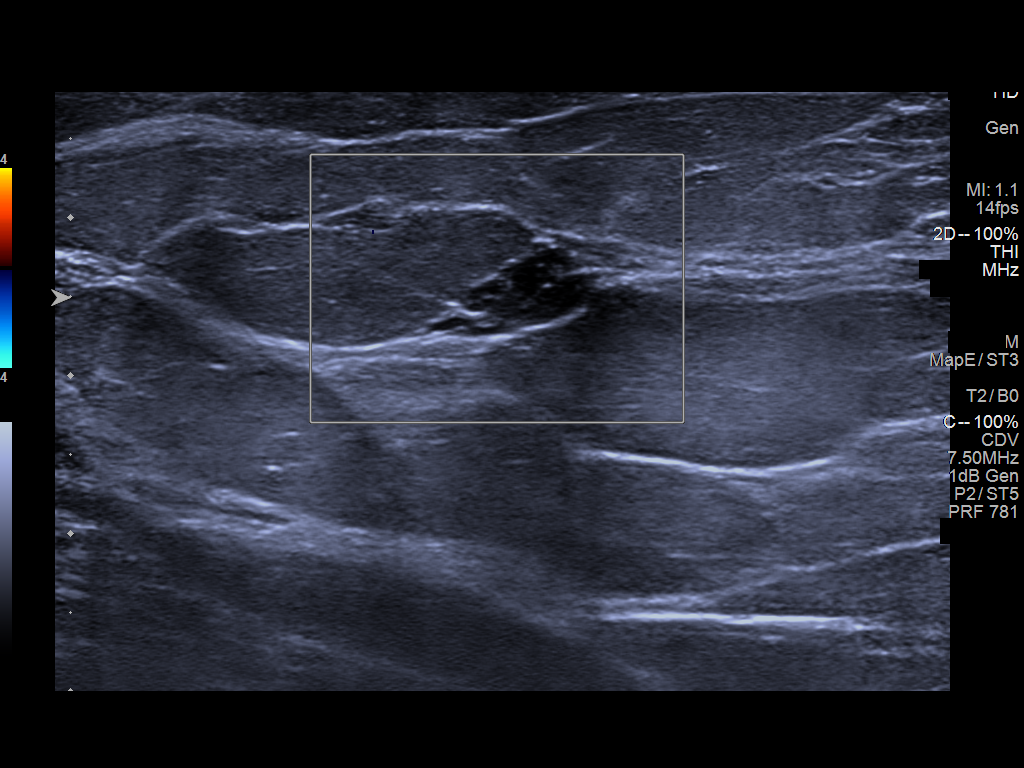

[6 of 6 positions shown; findings below may reference images not displayed]

FINDINGS: Targeted ultrasound is performed, showing an oval, circumscribed
hypoechoic mass at the 9 o'clock position 7 cm from the nipple. It
measures 1.7 x 0.5 x 1.0 cm (previously 1.6 x 0.4 x 1.1 cm).
IMPRESSION: Stable, probably benign right breast mass. Recommend continued
short-term follow-up.

RECOMMENDATION:
Bilateral diagnostic mammogram and right breast ultrasound in 6
months.

I have discussed the findings and recommendations with the patient.
If applicable, a reminder letter will be sent to the patient
regarding the next appointment.

BI-RADS CATEGORY  3: Probably benign.

## 2021-08-20 ENCOUNTER — Telehealth: Payer: Self-pay | Admitting: Internal Medicine

## 2021-08-20 ENCOUNTER — Other Ambulatory Visit: Payer: Self-pay | Admitting: Internal Medicine

## 2021-08-20 DIAGNOSIS — R928 Other abnormal and inconclusive findings on diagnostic imaging of breast: Secondary | ICD-10-CM

## 2021-08-20 NOTE — Telephone Encounter (Signed)
Lft pt vm to call [place] to sch appt due in Aug with the number call. thanks

## 2021-08-20 NOTE — Progress Notes (Signed)
Order placed for bilateral diagnostic mammogram and right breast ultrasound.  

## 2021-08-31 ENCOUNTER — Ambulatory Visit: Payer: Self-pay | Admitting: Pharmacist

## 2021-08-31 NOTE — Chronic Care Management (AMB) (Signed)
?  Chronic Care Management  ? ?Note ? ?08/31/2021 ?Name: [name] MRN: [id number] DOB: Oct 21, 1975 ? ? ? ?Closing pharmacy CCM case at this time. Patient has clinic contact information for future questions or concerns.  ? ?Catie Feliz Beam, PharmD, [place], CPP ?Clinical Pharmacist ?Nature conservation officer at [employer] ?[phone] ? ?

## 2021-09-15 ENCOUNTER — Encounter: Payer: Self-pay | Admitting: Internal Medicine

## 2021-09-15 DIAGNOSIS — M25562 Pain in left knee: Secondary | ICD-10-CM | POA: Insufficient documentation

## 2021-10-15 ENCOUNTER — Encounter: Payer: Self-pay | Admitting: Internal Medicine

## 2021-10-15 ENCOUNTER — Ambulatory Visit: Payer: BC Managed Care – PPO | Admitting: Internal Medicine

## 2021-10-15 DIAGNOSIS — E7800 Pure hypercholesterolemia, unspecified: Secondary | ICD-10-CM | POA: Diagnosis not present

## 2021-10-15 DIAGNOSIS — F439 Reaction to severe stress, unspecified: Secondary | ICD-10-CM | POA: Diagnosis not present

## 2021-10-15 DIAGNOSIS — R928 Other abnormal and inconclusive findings on diagnostic imaging of breast: Secondary | ICD-10-CM | POA: Diagnosis not present

## 2021-10-15 DIAGNOSIS — I10 Essential (primary) hypertension: Secondary | ICD-10-CM

## 2021-10-15 NOTE — Progress Notes (Signed)
Patient ID: Marcille BlancoJennifer L Hirota, female   DOB: 02/22/1976, 46 y.o.   MRN: [id number] ? ? ?Subjective:  ? ? Patient ID: Marcille BlancoJennifer L Lemieux, female    DOB: 02/24/1976, 46 y.o.   MRN: [id number] ? ?This visit occurred during the SARS-CoV-2 public health emergency.  Safety protocols were in place, including screening questions prior to the visit, additional usage of staff PPE, and extensive cleaning of exam room while observing appropriate contact time as indicated for disinfecting solutions.  ? ?Patient here for a scheduled follow up.  ? ?Chief Complaint  ?Patient presents with  ? Follow-up  ?  Follow up for HTN  ? .  ? ?HPI ?Increased stress.  Stress with job.  Stays busy with family.  Discussed.  Overall she feels she is handling things relatively well.  No chest pain or sob reported.  Is walking.  Lifting weights.  Palpitations are better.  Headaches better.  Taking magnesium.  No abdominal pain or bowel change reported.  Outside blood pressure readings 110-120/80-90.   ? ? ?Past Medical History:  ?Diagnosis Date  ? Allergy   ? Asthma   ? Fatigue   ? GERD (gastroesophageal reflux disease)   ? Heavy periods   ? Hyperlipidemia   ? Painful menstrual periods   ? Vitamin D deficiency   ? Yeast infection of the vagina   ? ?History reviewed. No pertinent surgical history. ?Family History  ?Problem Relation Age of Onset  ? Thyroid disease Mother   ? Hypertension Mother   ? Cancer Mother   ? Hyperlipidemia Mother   ? Thyroid disease Father   ? Hypertension Father   ? Cancer Father   ? Hyperlipidemia Father   ? Hearing loss Father   ? Thyroid disease Maternal Aunt   ? Thyroid disease Maternal Grandmother   ? Hypertension Maternal Grandmother   ? Heart disease Maternal Grandmother   ? Thyroid disease Paternal Grandmother   ? Hypertension Brother   ? Kidney disease Brother   ? Alcohol abuse Maternal Grandfather   ? Stroke Maternal Grandfather   ? Heart disease Maternal Grandfather   ? Hearing loss Paternal Grandfather   ? Hypertension  Paternal Grandfather   ? Hyperlipidemia Paternal Grandfather   ? Breast cancer Neg Hx   ? ?Social History  ? ?Socioeconomic History  ? Marital status: Married  ?  Spouse name: Not on file  ? Number of children: 3  ? Years of education: Not on file  ? Highest education level: Bachelor's degree (e.g., BA, AB, BS)  ?Occupational History  ? Not on file  ?Tobacco Use  ? Smoking status: Never  ? Smokeless tobacco: Never  ?Vaping Use  ? Vaping Use: Never used  ?Substance and Sexual Activity  ? Alcohol use: Yes  ? Drug use: No  ? Sexual activity: Yes  ?  Birth control/protection: I.U.D.  ?  Comment: mirena  ?Other Topics Concern  ? Not on file  ?Social History Narrative  ? Not on file  ? ?Social Determinants of Health  ? ?Financial Resource Strain: Low Risk   ? Difficulty of Paying Living Expenses: Not hard at all  ?Food Insecurity: Not on file  ?Transportation Needs: Not on file  ?Physical Activity: Not on file  ?Stress: Not on file  ?Social Connections: Not on file  ? ? ? ?Review of Systems  ?Constitutional:  Negative for appetite change and unexpected weight change.  ?HENT:  Negative for congestion and sinus pressure.   ?Respiratory:  Negative for cough, chest tightness and shortness of breath.   ?Cardiovascular:  Negative for chest pain, palpitations and leg swelling.  ?Gastrointestinal:  Negative for abdominal pain, diarrhea, nausea and vomiting.  ?Genitourinary:  Negative for difficulty urinating and dysuria.  ?Musculoskeletal:  Negative for joint swelling and myalgias.  ?Skin:  Negative for color change and rash.  ?Neurological:  Negative for dizziness, light-headedness and headaches.  ?Psychiatric/Behavioral:  Negative for agitation and dysphoric mood.   ? ?   ?Objective:  ?  ? ?BP 126/74 (BP Location: Left Arm, Patient Position: Sitting, Cuff Size: Small)   Pulse 64   Temp 97.6 ?F (36.4 ?C) (Temporal)   Resp 12   Ht 5\' 2"  (1.575 m)   Wt 171 lb (77.6 kg)   SpO2 98%   BMI 31.28 kg/m?  ?Wt Readings from Last 3  Encounters:  ?10/15/21 171 lb (77.6 kg)  ?08/02/21 175 lb 9.6 oz (79.7 kg)  ?03/19/21 169 lb 6.4 oz (76.8 kg)  ? ? ?Physical Exam ?Vitals reviewed.  ?Constitutional:   ?   General: She is not in acute distress. ?   Appearance: Normal appearance.  ?HENT:  ?   Head: Normocephalic and atraumatic.  ?   Right Ear: External ear normal.  ?   Left Ear: External ear normal.  ?Eyes:  ?   General: No scleral icterus.    ?   Right eye: No discharge.     ?   Left eye: No discharge.  ?   Conjunctiva/sclera: Conjunctivae normal.  ?Neck:  ?   Thyroid: No thyromegaly.  ?Cardiovascular:  ?   Rate and Rhythm: Normal rate and regular rhythm.  ?Pulmonary:  ?   Effort: No respiratory distress.  ?   Breath sounds: Normal breath sounds. No wheezing.  ?Abdominal:  ?   General: Bowel sounds are normal.  ?   Palpations: Abdomen is soft.  ?   Tenderness: There is no abdominal tenderness.  ?Musculoskeletal:     ?   General: No swelling or tenderness.  ?   Cervical back: Neck supple. No tenderness.  ?Lymphadenopathy:  ?   Cervical: No cervical adenopathy.  ?Skin: ?   Findings: No erythema or rash.  ?Neurological:  ?   Mental Status: She is alert.  ?Psychiatric:     ?   Mood and Affect: Mood normal.     ?   Behavior: Behavior normal.  ? ? ? ?Outpatient Encounter Medications as of 10/15/2021  ?Medication Sig  ? carvedilol (COREG) 6.25 MG tablet TAKE ONE (1) TABLET BY MOUTH TWO TIMES PER DAY WITH A MEAL  ? ibuprofen (ADVIL,MOTRIN) 200 MG tablet Take 200 mg by mouth every 6 (six) hours as needed.  ? lansoprazole (PREVACID) 15 MG capsule Take 15 mg by mouth daily.  ? levonorgestrel (MIRENA) 20 MCG/24HR IUD 1 each by Intrauterine route once.  ? magnesium oxide (MAG-OX) 400 (240 Mg) MG tablet Take 1 tablet (400 mg total) by mouth daily.  ? magnesium oxide (MAG-OX) 400 MG tablet Take 1 tablet by mouth daily.  ? Vitamin D, Ergocalciferol, (DRISDOL) 1.25 MG (50000 UNIT) CAPS capsule TAKE 1 CAPSULE BY MOUTH TWICE PER WEEK  ? ?No facility-administered  encounter medications on file as of 10/15/2021.  ?  ? ?Lab Results  ?Component Value Date  ? WBC 6.4 09/28/2020  ? HGB 14.5 09/28/2020  ? HCT 42.9 09/28/2020  ? PLT 263.0 09/28/2020  ? GLUCOSE 92 08/02/2021  ? CHOL 232 (H) 08/02/2021  ? TRIG  142.0 08/02/2021  ? HDL 43.20 08/02/2021  ? LDLCALC 160 (H) 08/02/2021  ? ALT 14 08/02/2021  ? AST 14 08/02/2021  ? NA 137 08/02/2021  ? K 4.4 08/02/2021  ? CL 105 08/02/2021  ? CREATININE 0.71 08/02/2021  ? BUN 22 08/02/2021  ? CO2 27 08/02/2021  ? TSH 2.76 09/28/2020  ? HGBA1C 5.2 08/30/2018  ? ? ?US BREAST LTD UNI RIGHT INC AXILLA ? ?Result Date: 08/19/2021 ?CLINICAL DATA:  46 year old female presenting for first six-month follow-up of a probably benign right breast mass. EXAM: ULTRASOUND OF THE RIGHT BREAST COMPARISON:  Previous exam(s). FINDINGS: Targeted ultrasound is performed, showing an oval, circumscribed hypoechoic mass at the 9 o'clock position 7 cm from the nipple. It measures 1.7 x 0.5 x 1.0 cm (previously 1.6 x 0.4 x 1.1 cm). IMPRESSION: Stable, probably benign right breast mass. Recommend continued short-term follow-up. RECOMMENDATION: Bilateral diagnostic mammogram and right breast ultrasound in 6 months. I have discussed the findings and recommendations with the patient. If applicable, a reminder letter will be sent to the patient regarding the next appointment. BI-RADS CATEGORY  3: Probably benign. Electronically Signed   By: Sande Brothers M.D.   On: 08/19/2021 14:40 ? ? ?   ?Assessment & Plan:  ? ?Problem List Items Addressed This Visit   ? ? Abnormal mammogram  ?  Mammogram 08/19/21 - [facility] III.  Recommended bilateral diagnostic mammogram and right breast ultrasound in 6 months.  Orders have been placed.  ? ?  ?  ? Hypercholesterolemia  ?  The 10-year ASCVD risk score (Arnett DK, et al., 2019) is: 1.9% ?  Values used to calculate the score: ?    Age: 11 years ?    Sex: Female ?    Is Non-Hispanic African American: No ?    Diabetic: No ?    Tobacco smoker:  No ?    Systolic Blood Pressure: 126 mmHg ?    Is BP treated: Yes ?    HDL Cholesterol: 43.2 mg/dL ?    Total Cholesterol: 232 mg/dL  ?Low cholesterol diet and exercise.  Follow lipid panel.  ? ?  ?  ? Hypertension, ess

## 2021-10-24 ENCOUNTER — Encounter: Payer: Self-pay | Admitting: Internal Medicine

## 2021-10-24 DIAGNOSIS — R928 Other abnormal and inconclusive findings on diagnostic imaging of breast: Secondary | ICD-10-CM | POA: Insufficient documentation

## 2021-10-24 NOTE — Assessment & Plan Note (Signed)
The 10-year ASCVD risk score (Arnett DK, et al., 2019) is: 1.9% ?  Values used to calculate the score: ?    Age: 46 years ?    Sex: Female ?    Is Non-Hispanic African American: No ?    Diabetic: No ?    Tobacco smoker: No ?    Systolic Blood Pressure: 123XX123 mmHg ?    Is BP treated: Yes ?    HDL Cholesterol: 43.2 mg/dL ?    Total Cholesterol: 232 mg/dL  ?Low cholesterol diet and exercise.  Follow lipid panel.  ?

## 2021-10-24 NOTE — Assessment & Plan Note (Signed)
Mammogram 08/19/21 - Birads III.  Recommended bilateral diagnostic mammogram and right breast ultrasound in 6 months.  Orders have been placed.  ?

## 2021-10-24 NOTE — Assessment & Plan Note (Signed)
Increased stress.  Discussed.  Overall appears to be handling things relatively well.  Notify me if feels needs any further intervention.  Follow.   °

## 2021-10-24 NOTE — Assessment & Plan Note (Addendum)
On carvedilol.  Blood pressure as outlined.  Discussed diet and exercise.  She is back exercising.  Hold on changing dose of coreg or adding medication.  Follow pressures and pulse. Send in readings, Follow metabolic panel.  ?

## 2021-11-01 ENCOUNTER — Other Ambulatory Visit: Payer: Self-pay | Admitting: Internal Medicine

## 2022-02-01 ENCOUNTER — Other Ambulatory Visit: Payer: Self-pay | Admitting: Internal Medicine

## 2022-03-03 ENCOUNTER — Encounter: Payer: Self-pay | Admitting: Internal Medicine

## 2022-03-05 ENCOUNTER — Other Ambulatory Visit: Payer: Self-pay | Admitting: Internal Medicine

## 2022-03-07 ENCOUNTER — Ambulatory Visit: Payer: BC Managed Care – PPO | Admitting: Internal Medicine

## 2022-03-07 ENCOUNTER — Encounter: Payer: Self-pay | Admitting: Internal Medicine

## 2022-03-07 DIAGNOSIS — E7800 Pure hypercholesterolemia, unspecified: Secondary | ICD-10-CM | POA: Diagnosis not present

## 2022-03-07 DIAGNOSIS — R42 Dizziness and giddiness: Secondary | ICD-10-CM

## 2022-03-07 DIAGNOSIS — I10 Essential (primary) hypertension: Secondary | ICD-10-CM

## 2022-03-07 DIAGNOSIS — F439 Reaction to severe stress, unspecified: Secondary | ICD-10-CM | POA: Diagnosis not present

## 2022-03-07 NOTE — Progress Notes (Signed)
Patient ID: Cassandra Greene, female   DOB: 1975/09/05, 46 y.o.   MRN: [id number]   Subjective:    Patient ID: Cassandra Greene, female    DOB: 10-15-1975, 46 y.o.   MRN: [id number]   Patient here for  Chief Complaint  Patient presents with   Dizziness   .   HPI Work in for persistent vertigo after taking a cruise. Went on a cruise beginning of July.  Since the cruise, she has felt off. Feels like she is tilted.  No room spinning.  Notices more when mowing/driving - bumpy.  Feels like she is angled back.  No headache.  Some days better.  No sinus congestion.  No cough or congestion.  Breathing stable.  Blood pressure ok.    Past Medical History:  Diagnosis Date   Allergy    Asthma    Fatigue    GERD (gastroesophageal reflux disease)    Heavy periods    Hyperlipidemia    Painful menstrual periods    Vitamin D deficiency    Yeast infection of the vagina    History reviewed. No pertinent surgical history. Family History  Problem Relation Age of Onset   Thyroid disease Mother    Hypertension Mother    Cancer Mother    Hyperlipidemia Mother    Thyroid disease Father    Hypertension Father    Cancer Father    Hyperlipidemia Father    Hearing loss Father    Thyroid disease Maternal Aunt    Thyroid disease Maternal Grandmother    Hypertension Maternal Grandmother    Heart disease Maternal Grandmother    Thyroid disease Paternal Grandmother    Hypertension Brother    Kidney disease Brother    Alcohol abuse Maternal Grandfather    Stroke Maternal Grandfather    Heart disease Maternal Grandfather    Hearing loss Paternal Grandfather    Hypertension Paternal Grandfather    Hyperlipidemia Paternal Grandfather    Breast cancer Neg Hx    Social History   Socioeconomic History   Marital status: Married    Spouse name: Not on file   Number of children: 3   Years of education: Not on file   Highest education level: Bachelor's degree (e.g., BA, AB, BS)  Occupational History    Not on file  Tobacco Use   Smoking status: Never   Smokeless tobacco: Never  Vaping Use   Vaping Use: Never used  Substance and Sexual Activity   Alcohol use: Yes   Drug use: No   Sexual activity: Yes    Birth control/protection: I.U.D.    Comment: mirena  Other Topics Concern   Not on file  Social History Narrative   Not on file   Social Determinants of Health   Financial Resource Strain: Low Risk  (01/22/2021)   Overall Financial Resource Strain (CARDIA)    Difficulty of Paying Living Expenses: Not hard at all  Food Insecurity: Not on file  Transportation Needs: Not on file  Physical Activity: Not on file  Stress: Not on file  Social Connections: Not on file     Review of Systems  Constitutional:  Negative for appetite change and unexpected weight change.  HENT:  Negative for congestion and sinus pressure.   Respiratory:  Negative for cough, chest tightness and shortness of breath.   Cardiovascular:  Negative for chest pain, palpitations and leg swelling.  Gastrointestinal:  Negative for abdominal pain, diarrhea, nausea and vomiting.  Genitourinary:  Negative for difficulty  urinating and dysuria.  Musculoskeletal:  Negative for joint swelling and myalgias.  Skin:  Negative for color change and rash.  Neurological:  Negative for headaches.       Feeling "off".  "Tilted"  Psychiatric/Behavioral:  Negative for agitation and dysphoric mood.        Objective:     BP 118/84   Pulse (!) 51   Temp 98.1 F (36.7 C) (Oral)   Resp 17   Ht 5\' 2"  (1.575 m)   Wt 167 lb 4 oz (75.9 kg)   SpO2 99%   BMI 30.59 kg/m  Wt Readings from Last 3 Encounters:  03/07/22 167 lb 4 oz (75.9 kg)  10/15/21 171 lb (77.6 kg)  08/02/21 175 lb 9.6 oz (79.7 kg)    Physical Exam Vitals reviewed.  Constitutional:      General: She is not in acute distress.    Appearance: Normal appearance.  HENT:     Head: Normocephalic and atraumatic.     Right Ear: Tympanic membrane, ear canal and  external ear normal.     Left Ear: Tympanic membrane, ear canal and external ear normal.     Nose: Nose normal.  Eyes:     General: No scleral icterus.       Right eye: No discharge.        Left eye: No discharge.     Conjunctiva/sclera: Conjunctivae normal.  Neck:     Thyroid: No thyromegaly.  Cardiovascular:     Rate and Rhythm: Normal rate and regular rhythm.  Pulmonary:     Effort: No respiratory distress.     Breath sounds: Normal breath sounds. No wheezing.  Abdominal:     General: Bowel sounds are normal.     Palpations: Abdomen is soft.     Tenderness: There is no abdominal tenderness.  Musculoskeletal:        General: No swelling or tenderness.     Cervical back: Neck supple. No tenderness.  Lymphadenopathy:     Cervical: No cervical adenopathy.  Skin:    Findings: No erythema or rash.  Neurological:     Mental Status: She is alert.  Psychiatric:        Mood and Affect: Mood normal.        Behavior: Behavior normal.      Outpatient Encounter Medications as of 03/07/2022  Medication Sig   carvedilol (COREG) 6.25 MG tablet TAKE ONE (1) TABLET BY MOUTH TWO TIMES PER DAY WITH A MEAL   ibuprofen (ADVIL,MOTRIN) 200 MG tablet Take 200 mg by mouth every 6 (six) hours as needed.   lansoprazole (PREVACID) 15 MG capsule Take 15 mg by mouth daily.   levonorgestrel (MIRENA) 20 MCG/24HR IUD 1 each by Intrauterine route once.   magnesium oxide (MAG-OX) 400 MG tablet TAKE 1 TABLET BY MOUTH DAILY   Vitamin D, Ergocalciferol, (DRISDOL) 1.25 MG (50000 UNIT) CAPS capsule TAKE 1 CAPSULE BY MOUTH TWICE PER WEEK   [DISCONTINUED] magnesium oxide (MAG-OX) 400 MG tablet Take 1 tablet by mouth daily.   [DISCONTINUED] magnesium oxide (MAG-OX) 400 MG tablet TAKE 1 TABLET BY MOUTH ONCE DAILY   No facility-administered encounter medications on file as of 03/07/2022.     Lab Results  Component Value Date   WBC 6.4 09/28/2020   HGB 14.5 09/28/2020   HCT 42.9 09/28/2020   PLT 263.0  09/28/2020   GLUCOSE 92 08/02/2021   CHOL 232 (H) 08/02/2021   TRIG 142.0 08/02/2021   HDL 43.20 08/02/2021  LDLCALC 160 (H) 08/02/2021   ALT 14 08/02/2021   AST 14 08/02/2021   NA 137 08/02/2021   K 4.4 08/02/2021   CL 105 08/02/2021   CREATININE 0.71 08/02/2021   BUN 22 08/02/2021   CO2 27 08/02/2021   TSH 2.76 09/28/2020   HGBA1C 5.2 08/30/2018    US BREAST LTD UNI RIGHT INC AXILLA  Result Date: 08/19/2021 CLINICAL DATA:  46 year old female presenting for first six-month follow-up of a probably benign right breast mass. EXAM: ULTRASOUND OF THE RIGHT BREAST COMPARISON:  Previous exam(s). FINDINGS: Targeted ultrasound is performed, showing an oval, circumscribed hypoechoic mass at the 9 o'clock position 7 cm from the nipple. It measures 1.7 x 0.5 x 1.0 cm (previously 1.6 x 0.4 x 1.1 cm). IMPRESSION: Stable, probably benign right breast mass. Recommend continued short-term follow-up. RECOMMENDATION: Bilateral diagnostic mammogram and right breast ultrasound in 6 months. I have discussed the findings and recommendations with the patient. If applicable, a reminder letter will be sent to the patient regarding the next appointment. BI-RADS CATEGORY  3: Probably benign. Electronically Signed   By: Sande Brothers M.D.   On: 08/19/2021 14:40      Assessment & Plan:   Problem List Items Addressed This Visit     Dizziness    Since her cruise, she has felt off.  Feels like she is tilted.  No room spinning.  Mowing/driving - worse.  Has tried sudafed/antihistamine.  Persistent.  Refer to ENT for evaluation.       Relevant Orders   Ambulatory referral to ENT   Hypercholesterolemia    The 10-year ASCVD risk score (Arnett DK, et al., 2019) is: 1.7%   Values used to calculate the score:     Age: 27 years     Sex: Female     Is Non-Hispanic African American: No     Diabetic: No     Tobacco smoker: No     Systolic Blood Pressure: 118 mmHg     Is BP treated: Yes     HDL Cholesterol: 43.2  mg/dL     Total Cholesterol: 232 mg/dL  Low cholesterol diet and exercise.  Follow lipid panel.       Hypertension, essential    On carvedilol.  Blood pressure as outlined.  No changes in medication.  Follow.       Stress    Overall appears to be handling things relatively well.  Follow.         Dale Lyons Switch, MD

## 2022-03-13 ENCOUNTER — Encounter: Payer: Self-pay | Admitting: Internal Medicine

## 2022-03-13 DIAGNOSIS — R42 Dizziness and giddiness: Secondary | ICD-10-CM | POA: Insufficient documentation

## 2022-03-13 NOTE — Assessment & Plan Note (Signed)
Overall appears to be handling things relatively well.  Follow.   

## 2022-03-13 NOTE — Assessment & Plan Note (Signed)
On carvedilol.  Blood pressure as outlined.  No changes in medication.  Follow.  

## 2022-03-13 NOTE — Assessment & Plan Note (Signed)
The 10-year ASCVD risk score (Arnett DK, et al., 2019) is: 1.7%   Values used to calculate the score:     Age: 46 years     Sex: Female     Is Non-Hispanic African American: No     Diabetic: No     Tobacco smoker: No     Systolic Blood Pressure: 889 mmHg     Is BP treated: Yes     HDL Cholesterol: 43.2 mg/dL     Total Cholesterol: 232 mg/dL  Low cholesterol diet and exercise.  Follow lipid panel.

## 2022-03-13 NOTE — Assessment & Plan Note (Signed)
Since her cruise, she has felt off.  Feels like she is tilted.  No room spinning.  Mowing/driving - worse.  Has tried sudafed/antihistamine.  Persistent.  Refer to ENT for evaluation.

## 2022-04-04 ENCOUNTER — Other Ambulatory Visit: Payer: Self-pay | Admitting: Family

## 2022-05-13 ENCOUNTER — Other Ambulatory Visit: Payer: BC Managed Care – PPO

## 2022-06-06 ENCOUNTER — Ambulatory Visit: Payer: BC Managed Care – PPO | Admitting: Internal Medicine

## 2022-06-06 NOTE — Assessment & Plan Note (Deleted)
On carvedilol.  Blood pressure as outlined.

## 2022-06-06 NOTE — Progress Notes (Deleted)
Patient ID: ANABELL SWINT, female   DOB: April 29, 1976, 46 y.o.   MRN: [id number]   Subjective:    Patient ID: [name], female    DOB: 1976/06/18, 46 y.o.   MRN: [id number]  Patient here for No chief complaint on file.  Marland Kitchen   HPI Here to follow up regarding her blood pressure and cholesterol.  Was worked in last visit for persistent vertigo - after taking a cruise.     Past Medical History:  Diagnosis Date   Allergy    Asthma    Fatigue    GERD (gastroesophageal reflux disease)    Heavy periods    Hyperlipidemia    Painful menstrual periods    Vitamin D deficiency    Yeast infection of the vagina    No past surgical history on file. Family History  Problem Relation Age of Onset   Thyroid disease Mother    Hypertension Mother    Cancer Mother    Hyperlipidemia Mother    Thyroid disease Father    Hypertension Father    Cancer Father    Hyperlipidemia Father    Hearing loss Father    Thyroid disease Maternal Aunt    Thyroid disease Maternal Grandmother    Hypertension Maternal Grandmother    Heart disease Maternal Grandmother    Thyroid disease Paternal Grandmother    Hypertension Brother    Kidney disease Brother    Alcohol abuse Maternal Grandfather    Stroke Maternal Grandfather    Heart disease Maternal Grandfather    Hearing loss Paternal Grandfather    Hypertension Paternal Grandfather    Hyperlipidemia Paternal Grandfather    Breast cancer Neg Hx    Social History   Socioeconomic History   Marital status: Married    Spouse name: Not on file   Number of children: 3   Years of education: Not on file   Highest education level: Bachelor's degree (e.g., BA, AB, BS)  Occupational History   Not on file  Tobacco Use   Smoking status: Never   Smokeless tobacco: Never  Vaping Use   Vaping Use: Never used  Substance and Sexual Activity   Alcohol use: Yes   Drug use: No   Sexual activity: Yes    Birth control/protection: I.U.D.    Comment: mirena   Other Topics Concern   Not on file  Social History Narrative   Not on file   Social Determinants of Health   Financial Resource Strain: Low Risk  (01/22/2021)   Overall Financial Resource Strain (CARDIA)    Difficulty of Paying Living Expenses: Not hard at all  Food Insecurity: Not on file  Transportation Needs: Not on file  Physical Activity: Not on file  Stress: Not on file  Social Connections: Not on file     Review of Systems     Objective:     There were no vitals taken for this visit. Wt Readings from Last 3 Encounters:  03/07/22 167 lb 4 oz (75.9 kg)  10/15/21 171 lb (77.6 kg)  08/02/21 175 lb 9.6 oz (79.7 kg)    Physical Exam   Outpatient Encounter Medications as of 06/06/2022  Medication Sig   carvedilol (COREG) 6.25 MG tablet TAKE ONE (1) TABLET BY MOUTH TWO TIMES PER DAY WITH A MEAL   ibuprofen (ADVIL,MOTRIN) 200 MG tablet Take 200 mg by mouth every 6 (six) hours as needed.   lansoprazole (PREVACID) 15 MG capsule Take 15 mg by mouth daily.  levonorgestrel (MIRENA) 20 MCG/24HR IUD 1 each by Intrauterine route once.   magnesium oxide (MAG-OX) 400 MG tablet TAKE 1 TABLET BY MOUTH DAILY   Vitamin D, Ergocalciferol, (DRISDOL) 1.25 MG (50000 UNIT) CAPS capsule TAKE 1 CAPSULE BY MOUTH TWICE PER WEEK   No facility-administered encounter medications on file as of 06/06/2022.     Lab Results  Component Value Date   WBC 6.4 09/28/2020   HGB 14.5 09/28/2020   HCT 42.9 09/28/2020   PLT 263.0 09/28/2020   GLUCOSE 92 08/02/2021   CHOL 232 (H) 08/02/2021   TRIG 142.0 08/02/2021   HDL 43.20 08/02/2021   LDLCALC 160 (H) 08/02/2021   ALT 14 08/02/2021   AST 14 08/02/2021   NA 137 08/02/2021   K 4.4 08/02/2021   CL 105 08/02/2021   CREATININE 0.71 08/02/2021   BUN 22 08/02/2021   CO2 27 08/02/2021   TSH 2.76 09/28/2020   HGBA1C 5.2 08/30/2018    US BREAST LTD UNI RIGHT INC AXILLA  Result Date: 08/19/2021 CLINICAL DATA:  46 year old female presenting for  first six-month follow-up of a probably benign right breast mass. EXAM: ULTRASOUND OF THE RIGHT BREAST COMPARISON:  Previous exam(s). FINDINGS: Targeted ultrasound is performed, showing an oval, circumscribed hypoechoic mass at the 9 o'clock position 7 cm from the nipple. It measures 1.7 x 0.5 x 1.0 cm (previously 1.6 x 0.4 x 1.1 cm). IMPRESSION: Stable, probably benign right breast mass. Recommend continued short-term follow-up. RECOMMENDATION: Bilateral diagnostic mammogram and right breast ultrasound in 6 months. I have discussed the findings and recommendations with the patient. If applicable, a reminder letter will be sent to the patient regarding the next appointment. BI-RADS CATEGORY  3: Probably benign. Electronically Signed   By: Sande Brothers M.D.   On: 08/19/2021 14:40      Assessment & Plan:   Problem List Items Addressed This Visit   None    Dale [place], MD

## 2022-06-06 NOTE — Assessment & Plan Note (Deleted)
Mammogram 08/19/21 - Birads III.  Recommended bilateral diagnostic mammogram and right breast ultrasound in 6 months.  Scheduled for f/u 06/16/22.

## 2022-06-14 ENCOUNTER — Ambulatory Visit: Payer: BC Managed Care – PPO | Admitting: Internal Medicine

## 2022-06-16 ENCOUNTER — Ambulatory Visit
Admission: RE | Admit: 2022-06-16 | Discharge: 2022-06-16 | Disposition: A | Discharge status: Home / Self Care | Payer: BC Managed Care – PPO | Source: Ambulatory Visit | Attending: Internal Medicine | Admitting: Internal Medicine

## 2022-06-16 DIAGNOSIS — R928 Other abnormal and inconclusive findings on diagnostic imaging of breast: Secondary | ICD-10-CM | POA: Insufficient documentation

## 2022-06-17 ENCOUNTER — Telehealth: Payer: Self-pay

## 2022-06-17 NOTE — Telephone Encounter (Signed)
-----   Message from Dale Burton, MD sent at 06/17/2022  4:33 AM EST ----- Please call and notify Cassandra Greene that I reviewed her mammogram and ultrasound report and they are ok.  Report - stable cysts.  They recommend f/u bilateral diagnostic mammogram with right breast ultrasound in one year.

## 2022-06-17 NOTE — Telephone Encounter (Signed)
LMTCB in regards to results.  

## 2022-07-05 ENCOUNTER — Other Ambulatory Visit: Payer: Self-pay | Admitting: Family

## 2022-07-20 ENCOUNTER — Encounter: Payer: Self-pay | Admitting: Internal Medicine

## 2022-07-20 ENCOUNTER — Ambulatory Visit: Payer: BC Managed Care – PPO | Admitting: Internal Medicine

## 2022-07-20 VITALS — BP 118/70 | HR 74 | Temp 97.9°F | Resp 16 | Ht 61.0 in | Wt 170.0 lb

## 2022-07-20 DIAGNOSIS — I10 Essential (primary) hypertension: Secondary | ICD-10-CM | POA: Diagnosis not present

## 2022-07-20 DIAGNOSIS — Z124 Encounter for screening for malignant neoplasm of cervix: Secondary | ICD-10-CM

## 2022-07-20 DIAGNOSIS — E559 Vitamin D deficiency, unspecified: Secondary | ICD-10-CM | POA: Diagnosis not present

## 2022-07-20 DIAGNOSIS — Z1211 Encounter for screening for malignant neoplasm of colon: Secondary | ICD-10-CM

## 2022-07-20 DIAGNOSIS — E7800 Pure hypercholesterolemia, unspecified: Secondary | ICD-10-CM | POA: Diagnosis not present

## 2022-07-20 DIAGNOSIS — F439 Reaction to severe stress, unspecified: Secondary | ICD-10-CM

## 2022-07-20 DIAGNOSIS — R42 Dizziness and giddiness: Secondary | ICD-10-CM

## 2022-07-20 DIAGNOSIS — R928 Other abnormal and inconclusive findings on diagnostic imaging of breast: Secondary | ICD-10-CM

## 2022-07-20 LAB — LIPID PANEL
Cholesterol: 203 mg/dL — ABNORMAL HIGH (ref 0–200)
HDL: 41.9 mg/dL (ref >39.00)
LDL Cholesterol: 136 mg/dL — ABNORMAL HIGH (ref 0–99)
NonHDL: 160.87
Total CHOL/HDL Ratio: 5
Triglycerides: 124 mg/dL (ref 0.0–149.0)
VLDL: 24.8 mg/dL (ref 0.0–40.0)

## 2022-07-20 LAB — COMPREHENSIVE METABOLIC PANEL
ALT: 15 U/L (ref 0–35)
AST: 17 U/L (ref 0–37)
Albumin: 4.3 g/dL (ref 3.5–5.2)
Alkaline Phosphatase: 47 U/L (ref 39–117)
BUN: 23 mg/dL (ref 6–23)
CO2: 25 mEq/L (ref 19–32)
Calcium: 9.2 mg/dL (ref 8.4–10.5)
Chloride: 103 mEq/L (ref 96–112)
Creatinine, Ser: 0.78 mg/dL (ref 0.40–1.20)
GFR: 91.53 mL/min (ref >60.00)
Glucose, Bld: 95 mg/dL (ref 70–99)
Potassium: 4.4 mEq/L (ref 3.5–5.1)
Sodium: 136 mEq/L (ref 135–145)
Total Bilirubin: 0.6 mg/dL (ref 0.2–1.2)
Total Protein: 7 g/dL (ref 6.0–8.3)

## 2022-07-20 LAB — CBC WITH DIFFERENTIAL/PLATELET
Basophils Absolute: 0 10*3/uL (ref 0.0–0.1)
Basophils Relative: 0.6 % (ref 0.0–3.0)
Eosinophils Absolute: 0.2 10*3/uL (ref 0.0–0.7)
Eosinophils Relative: 3.6 % (ref 0.0–5.0)
HCT: 41.5 % (ref 36.0–46.0)
Hemoglobin: 14.2 g/dL (ref 12.0–15.0)
Lymphocytes Relative: 39.3 % (ref 12.0–46.0)
Lymphs Abs: 2.1 10*3/uL (ref 0.7–4.0)
MCHC: 34.3 g/dL (ref 30.0–36.0)
MCV: 89 fl (ref 78.0–100.0)
Monocytes Absolute: 0.4 10*3/uL (ref 0.1–1.0)
Monocytes Relative: 6.9 % (ref 3.0–12.0)
Neutro Abs: 2.7 10*3/uL (ref 1.4–7.7)
Neutrophils Relative %: 49.6 % (ref 43.0–77.0)
Platelets: 283 10*3/uL (ref 150.0–400.0)
RBC: 4.66 Mil/uL (ref 3.87–5.11)
RDW: 12.6 % (ref 11.5–15.5)
WBC: 5.4 10*3/uL (ref 4.0–10.5)

## 2022-07-20 LAB — TSH: TSH: 2.78 u[IU]/mL (ref 0.35–5.50)

## 2022-07-20 MED ORDER — CARVEDILOL 6.25 MG PO TABS: ORAL_TABLET | ORAL | 1 refills | Status: DC

## 2022-07-20 NOTE — Progress Notes (Signed)
Subjective:    Patient ID: Cassandra Greene, female    DOB: 02/10/1976, 47 y.o.   MRN: [id number]  Patient here for  Chief Complaint  Patient presents with   Medical Management of Chronic Issues    HPI Here to follow up regarding her blood pressure and cholesterol.  She is doing relatively well.  Still with some dizziness.  Saw ENT.  Vestibular therapy - did not make a difference.  Still does some maneuvers at home.  Is overall better than initial symptoms.  No headache.  No chest pain or sob.  No increased cough or congestion.  No abdominal pain or bowel change reported.  Back to gym.  Reported some increased anxiety recently.  Noticed with driving.  Discussed.  Will notify me if feels needs any further intervention.  Does not feel needs at this time.     Past Medical History:  Diagnosis Date   Allergy    Asthma    Fatigue    GERD (gastroesophageal reflux disease)    Heavy periods    Hyperlipidemia    Painful menstrual periods    Vitamin D deficiency    Yeast infection of the vagina    History reviewed. No pertinent surgical history. Family History  Problem Relation Age of Onset   Thyroid disease Mother    Hypertension Mother    Cancer Mother    Hyperlipidemia Mother    Thyroid disease Father    Hypertension Father    Cancer Father    Hyperlipidemia Father    Hearing loss Father    Thyroid disease Maternal Aunt    Thyroid disease Maternal Grandmother    Hypertension Maternal Grandmother    Heart disease Maternal Grandmother    Thyroid disease Paternal Grandmother    Hypertension Brother    Kidney disease Brother    Alcohol abuse Maternal Grandfather    Stroke Maternal Grandfather    Heart disease Maternal Grandfather    Hearing loss Paternal Grandfather    Hypertension Paternal Grandfather    Hyperlipidemia Paternal Grandfather    Breast cancer Neg Hx    Social History   Socioeconomic History   Marital status: Married    Spouse name: Not on file   Number of  children: 3   Years of education: Not on file   Highest education level: Bachelor's degree (e.g., BA, AB, BS)  Occupational History   Not on file  Tobacco Use   Smoking status: Never   Smokeless tobacco: Never  Vaping Use   Vaping Use: Never used  Substance and Sexual Activity   Alcohol use: Yes   Drug use: No   Sexual activity: Yes    Birth control/protection: I.U.D.    Comment: mirena  Other Topics Concern   Not on file  Social History Narrative   Not on file   Social Determinants of Health   Financial Resource Strain: Low Risk  (01/22/2021)   Overall Financial Resource Strain (CARDIA)    Difficulty of Paying Living Expenses: Not hard at all  Food Insecurity: Not on file  Transportation Needs: Not on file  Physical Activity: Not on file  Stress: Not on file  Social Connections: Not on file     Review of Systems  Constitutional:  Negative for appetite change and unexpected weight change.  HENT:  Negative for congestion and sinus pressure.   Respiratory:  Negative for cough, chest tightness and shortness of breath.   Cardiovascular:  Negative for chest pain, palpitations and  leg swelling.  Gastrointestinal:  Negative for abdominal pain, diarrhea, nausea and vomiting.  Genitourinary:  Negative for difficulty urinating and dysuria.  Musculoskeletal:  Negative for joint swelling and myalgias.  Skin:  Negative for color change and rash.  Neurological:  Negative for headaches.       Dizziness/light headedness - as outlined.   Psychiatric/Behavioral:  Negative for agitation and dysphoric mood.        Objective:     BP 118/70   Pulse 74   Temp 97.9 F (36.6 C)   Resp 16   Ht 5\' 1"  (1.549 m)   Wt 170 lb (77.1 kg)   SpO2 97%   BMI 32.12 kg/m  Wt Readings from Last 3 Encounters:  07/20/22 170 lb (77.1 kg)  03/07/22 167 lb 4 oz (75.9 kg)  10/15/21 171 lb (77.6 kg)    Physical Exam Vitals reviewed.  Constitutional:      General: She is not in acute  distress.    Appearance: Normal appearance.  HENT:     Head: Normocephalic and atraumatic.     Right Ear: External ear normal.     Left Ear: External ear normal.  Eyes:     General: No scleral icterus.       Right eye: No discharge.        Left eye: No discharge.     Conjunctiva/sclera: Conjunctivae normal.  Neck:     Thyroid: No thyromegaly.  Cardiovascular:     Rate and Rhythm: Normal rate and regular rhythm.  Pulmonary:     Effort: No respiratory distress.     Breath sounds: Normal breath sounds. No wheezing.  Abdominal:     General: Bowel sounds are normal.     Palpations: Abdomen is soft.     Tenderness: There is no abdominal tenderness.  Musculoskeletal:        General: No swelling or tenderness.     Cervical back: Neck supple. No tenderness.  Lymphadenopathy:     Cervical: No cervical adenopathy.  Skin:    Findings: No erythema or rash.  Neurological:     Mental Status: She is alert.  Psychiatric:        Mood and Affect: Mood normal.        Behavior: Behavior normal.      Outpatient Encounter Medications as of 07/20/2022  Medication Sig   carvedilol (COREG) 6.25 MG tablet TAKE ONE (1) TABLET BY MOUTH TWO TIMES PER DAY WITH A MEAL   ibuprofen (ADVIL,MOTRIN) 200 MG tablet Take 200 mg by mouth every 6 (six) hours as needed.   lansoprazole (PREVACID) 15 MG capsule Take 15 mg by mouth daily.   levonorgestrel (MIRENA) 20 MCG/24HR IUD 1 each by Intrauterine route once.   magnesium oxide (MAG-OX) 400 MG tablet TAKE 1 TABLET BY MOUTH DAILY   Vitamin D, Ergocalciferol, (DRISDOL) 1.25 MG (50000 UNIT) CAPS capsule TAKE 1 CAPSULE BY MOUTH TWICE PER WEEK   [DISCONTINUED] carvedilol (COREG) 6.25 MG tablet TAKE ONE (1) TABLET BY MOUTH TWO TIMES PER DAY WITH A MEAL   No facility-administered encounter medications on file as of 07/20/2022.     Lab Results  Component Value Date   WBC 5.4 07/20/2022   HGB 14.2 07/20/2022   HCT 41.5 07/20/2022   PLT 283.0 07/20/2022   GLUCOSE  95 07/20/2022   CHOL 203 (H) 07/20/2022   TRIG 124.0 07/20/2022   HDL 41.90 07/20/2022   LDLCALC 136 (H) 07/20/2022   ALT 15 07/20/2022  AST 17 07/20/2022   NA 136 07/20/2022   K 4.4 07/20/2022   CL 103 07/20/2022   CREATININE 0.78 07/20/2022   BUN 23 07/20/2022   CO2 25 07/20/2022   TSH 2.78 07/20/2022   HGBA1C 5.2 08/30/2018    MM DIAG BREAST TOMO BILATERAL  Result Date: 06/16/2022 CLINICAL DATA:  One year follow-up of probably benign right breast mass. EXAM: DIGITAL DIAGNOSTIC BILATERAL MAMMOGRAM WITH TOMOSYNTHESIS; ULTRASOUND RIGHT BREAST LIMITED TECHNIQUE: Bilateral digital diagnostic mammography and breast tomosynthesis was performed.; Targeted ultrasound examination of the right breast was performed COMPARISON:  Previous exam(s). ACR Breast Density Category b: There are scattered areas of fibroglandular density. FINDINGS: Mammographically, there are no new suspicious masses, areas of architectural distortion or microcalcifications in either breast. There is a stable circumscribed mass in the outer upper right breast, middle depth. The finding is better seen on the craniocaudal view. Targeted right breast ultrasound is performed in demonstrates 9 o'clock 7 cm from nipple stable cluster of probably benign cysts measuring 1.7 x 0.4 x 1.0 cm. IMPRESSION: Stable right breast 9 o'clock probably benign cluster of cysts. No mammographic evidence of left breast malignancy. RECOMMENDATION: Recommend bilateral diagnostic mammogram and focused right breast ultrasound in 1 year. I have discussed the findings and recommendations with the patient. If applicable, a reminder letter will be sent to the patient regarding the next appointment. BI-RADS CATEGORY  3: Probably benign. Electronically Signed   By: Fidela Salisbury M.D.   On: 06/16/2022 11:42  US BREAST LTD UNI RIGHT INC AXILLA  Result Date: 06/16/2022 CLINICAL DATA:  One year follow-up of probably benign right breast mass. EXAM: DIGITAL  DIAGNOSTIC BILATERAL MAMMOGRAM WITH TOMOSYNTHESIS; ULTRASOUND RIGHT BREAST LIMITED TECHNIQUE: Bilateral digital diagnostic mammography and breast tomosynthesis was performed.; Targeted ultrasound examination of the right breast was performed COMPARISON:  Previous exam(s). ACR Breast Density Category b: There are scattered areas of fibroglandular density. FINDINGS: Mammographically, there are no new suspicious masses, areas of architectural distortion or microcalcifications in either breast. There is a stable circumscribed mass in the outer upper right breast, middle depth. The finding is better seen on the craniocaudal view. Targeted right breast ultrasound is performed in demonstrates 9 o'clock 7 cm from nipple stable cluster of probably benign cysts measuring 1.7 x 0.4 x 1.0 cm. IMPRESSION: Stable right breast 9 o'clock probably benign cluster of cysts. No mammographic evidence of left breast malignancy. RECOMMENDATION: Recommend bilateral diagnostic mammogram and focused right breast ultrasound in 1 year. I have discussed the findings and recommendations with the patient. If applicable, a reminder letter will be sent to the patient regarding the next appointment. BI-RADS CATEGORY  3: Probably benign. Electronically Signed   By: Fidela Salisbury M.D.   On: 06/16/2022 11:42      Assessment & Plan:  Hypertension, essential Assessment & Plan: On carvedilol.  Blood pressure as outlined.  No changes in medication.  Follow.   Orders: -     Comprehensive metabolic panel  Hypercholesterolemia Assessment & Plan: The 10-year ASCVD risk score (Arnett DK, et al., 2019) is: 1.5%   Values used to calculate the score:     Age: 50 years     Sex: Female     Is Non-Hispanic African American: No     Diabetic: No     Tobacco smoker: No     Systolic Blood Pressure: 542 mmHg     Is BP treated: Yes     HDL Cholesterol: 41.9 mg/dL     Total Cholesterol:  203 mg/dL  Low cholesterol diet and exercise.  Follow  lipid panel.   Orders: -     CBC with Differential/Platelet -     TSH -     Lipid panel  Vitamin D deficiency  Colon cancer screening Assessment & Plan: Due colonoscopy.  Refer to GI.   Orders: -     Ambulatory referral to Gastroenterology  Cervical cancer screening Assessment & Plan: Continues f/u Dr Leafy Ro.     Dizziness Assessment & Plan: Since her cruise, she has had some persistent symptoms.  Saw ENT for evaluation.  Vestibular therapy.  Did not feel made a big difference.  Overall has improved.  Follow.     Stress Assessment & Plan: Overall appears to be handling things relatively well.  Follow. Will notify me if feels needs any further intervention.    Abnormal mammogram Assessment & Plan: Bilateral diagnostic mammogram with right breast ultrasound (06/16/22) - [facility].  Recommend bilateral diagnostic mammogram and focused right breast ultrasound in 1 year.   Other orders -     Carvedilol; TAKE ONE (1) TABLET BY MOUTH TWO TIMES PER DAY WITH A MEAL  Dispense: 180 tablet; Refill: 1     Einar Pheasant, MD

## 2022-07-24 ENCOUNTER — Encounter: Payer: Self-pay | Admitting: Internal Medicine

## 2022-07-24 DIAGNOSIS — Z1211 Encounter for screening for malignant neoplasm of colon: Secondary | ICD-10-CM | POA: Insufficient documentation

## 2022-07-24 NOTE — Assessment & Plan Note (Signed)
On carvedilol.  Blood pressure as outlined.  No changes in medication.  Follow.

## 2022-07-24 NOTE — Assessment & Plan Note (Signed)
Since her cruise, she has had some persistent symptoms.  Saw ENT for evaluation.  Vestibular therapy.  Did not feel made a big difference.  Overall has improved.  Follow.

## 2022-07-24 NOTE — Assessment & Plan Note (Signed)
The 10-year ASCVD risk score (Arnett DK, et al., 2019) is: 1.5%   Values used to calculate the score:     Age: 47 years     Sex: Female     Is Non-Hispanic African American: No     Diabetic: No     Tobacco smoker: No     Systolic Blood Pressure: 440 mmHg     Is BP treated: Yes     HDL Cholesterol: 41.9 mg/dL     Total Cholesterol: 203 mg/dL  Low cholesterol diet and exercise.  Follow lipid panel.

## 2022-07-24 NOTE — Assessment & Plan Note (Signed)
Due colonoscopy.  Refer to GI.

## 2022-07-24 NOTE — Assessment & Plan Note (Signed)
Overall appears to be handling things relatively well.  Follow. Will notify me if feels needs any further intervention.

## 2022-07-24 NOTE — Assessment & Plan Note (Signed)
Bilateral diagnostic mammogram with right breast ultrasound (06/16/22) - [facility].  Recommend bilateral diagnostic mammogram and focused right breast ultrasound in 1 year.

## 2022-07-24 NOTE — Assessment & Plan Note (Signed)
Continues f/u Dr Leafy Ro.

## 2022-09-07 ENCOUNTER — Encounter: Payer: Self-pay | Admitting: Internal Medicine

## 2022-11-11 ENCOUNTER — Other Ambulatory Visit: Payer: Self-pay | Admitting: Internal Medicine

## 2022-11-18 ENCOUNTER — Ambulatory Visit: Payer: BC Managed Care – PPO | Admitting: Internal Medicine

## 2022-11-22 ENCOUNTER — Ambulatory Visit (INDEPENDENT_AMBULATORY_CARE_PROVIDER_SITE_OTHER): Payer: BC Managed Care – PPO | Admitting: Nurse Practitioner

## 2022-11-22 ENCOUNTER — Encounter: Payer: Self-pay | Admitting: Internal Medicine

## 2022-11-22 ENCOUNTER — Encounter: Payer: Self-pay | Admitting: Nurse Practitioner

## 2022-11-22 VITALS — BP 118/76 | HR 66 | Temp 98.0°F | Ht 61.0 in | Wt 172.0 lb

## 2022-11-22 DIAGNOSIS — R21 Rash and other nonspecific skin eruption: Secondary | ICD-10-CM | POA: Insufficient documentation

## 2022-11-22 MED ORDER — VALACYCLOVIR HCL 1 G PO TABS: 1000.0000 mg | ORAL_TABLET | Freq: Two times a day (BID) | ORAL | 0 refills | Status: AC

## 2022-11-22 NOTE — Progress Notes (Signed)
Established Patient Office Visit  Subjective:  Patient ID: Cassandra Greene, female    DOB: Jul 07, 1975  Age: 47 y.o. MRN: [id number]  CC:  Chief Complaint  Patient presents with   Acute Visit    Bumps, pain & burning since 11/18/22 under right breast    HPI  Cassandra Greene presents for small bumps, pain and burning sensation under the right breast area since last 4 days. Shs has noticed the pain before the bumps.    HPI   Past Medical History:  Diagnosis Date   Allergy    Asthma    Fatigue    GERD (gastroesophageal reflux disease)    Heavy periods    Hyperlipidemia    Painful menstrual periods    Vitamin D deficiency    Yeast infection of the vagina     No past surgical history on file.  Family History  Problem Relation Age of Onset   Thyroid disease Mother    Hypertension Mother    Cancer Mother    Hyperlipidemia Mother    Thyroid disease Father    Hypertension Father    Cancer Father    Hyperlipidemia Father    Hearing loss Father    Thyroid disease Maternal Aunt    Thyroid disease Maternal Grandmother    Hypertension Maternal Grandmother    Heart disease Maternal Grandmother    Thyroid disease Paternal Grandmother    Hypertension Brother    Kidney disease Brother    Alcohol abuse Maternal Grandfather    Stroke Maternal Grandfather    Heart disease Maternal Grandfather    Hearing loss Paternal Grandfather    Hypertension Paternal Grandfather    Hyperlipidemia Paternal Grandfather    Breast cancer Neg Hx     Social History   Socioeconomic History   Marital status: Married    Spouse name: Not on file   Number of children: 3   Years of education: Not on file   Highest education level: Bachelor's degree (e.g., BA, AB, BS)  Occupational History   Not on file  Tobacco Use   Smoking status: Never   Smokeless tobacco: Never  Vaping Use   Vaping Use: Never used  Substance and Sexual Activity   Alcohol use: Yes   Drug use: No   Sexual  activity: Yes    Birth control/protection: I.U.D.    Comment: mirena  Other Topics Concern   Not on file  Social History Narrative   Not on file   Social Determinants of Health   Financial Resource Strain: Low Risk  (01/22/2021)   Overall Financial Resource Strain (CARDIA)    Difficulty of Paying Living Expenses: Not hard at all  Food Insecurity: Not on file  Transportation Needs: Not on file  Physical Activity: Not on file  Stress: Not on file  Social Connections: Not on file  Intimate Partner Violence: Not on file     Outpatient Medications Prior to Visit  Medication Sig Dispense Refill   carvedilol (COREG) 6.25 MG tablet TAKE ONE TABLET BY MOUTH TWICE DAILY WITH A MEAL 180 tablet 1   ibuprofen (ADVIL,MOTRIN) 200 MG tablet Take 200 mg by mouth every 6 (six) hours as needed.     lansoprazole (PREVACID) 15 MG capsule Take 15 mg by mouth daily.     levonorgestrel (MIRENA) 20 MCG/24HR IUD 1 each by Intrauterine route once.     magnesium oxide (MAG-OX) 400 MG tablet TAKE 1 TABLET BY MOUTH DAILY 30 tablet 1  Vitamin D, Ergocalciferol, (DRISDOL) 1.25 MG (50000 UNIT) CAPS capsule TAKE 1 CAPSULE BY MOUTH TWICE PER WEEK 30 capsule 2   No facility-administered medications prior to visit.    No Known Allergies  ROS Review of Systems  Constitutional: Negative.   HENT: Negative.    Respiratory:  Negative for cough.   Cardiovascular:  Negative for leg swelling.  Skin:  Positive for rash.  Neurological: Negative.   Psychiatric/Behavioral: Negative.        Objective:    Physical Exam Constitutional:      Appearance: Normal appearance.  Cardiovascular:     Rate and Rhythm: Normal rate and regular rhythm.     Pulses: Normal pulses.     Heart sounds: Normal heart sounds. No murmur heard. Pulmonary:     Effort: Pulmonary effort is normal.     Breath sounds: No stridor. No wheezing.  Chest:       Comments: Linear erythematous rash under right breast. Skin:    Capillary  Refill: Capillary refill takes less than 2 seconds.  Neurological:     General: No focal deficit present.     Mental Status: She is alert and oriented to person, place, and time. Mental status is at baseline.  Psychiatric:        Mood and Affect: Mood normal.        Behavior: Behavior normal.        Thought Content: Thought content normal.        Judgment: Judgment normal.     BP 118/76   Pulse 66   Temp 98 F (36.7 C)   Ht 5\' 1"  (1.549 m)   Wt 172 lb (78 kg)   SpO2 97%   BMI 32.50 kg/m  Wt Readings from Last 3 Encounters:  11/24/22 172 lb (78 kg)  11/22/22 172 lb (78 kg)  07/20/22 170 lb (77.1 kg)     Health Maintenance  Topic Date Due   HIV Screening  Never done   Hepatitis C Screening  Never done   DTaP/Tdap/Td (1 - Tdap) Never done   PAP SMEAR-Modifier  08/29/2021   Colonoscopy  Never done   COVID-19 Vaccine (1) 12/08/2022 (Originally 04/28/1976)   INFLUENZA VACCINE  01/26/2023   HPV VACCINES  Aged Out    There are no preventive care reminders to display for this patient.  Lab Results  Component Value Date   TSH 2.78 07/20/2022   Lab Results  Component Value Date   WBC 5.4 07/20/2022   HGB 14.2 07/20/2022   HCT 41.5 07/20/2022   MCV 89.0 07/20/2022   PLT 283.0 07/20/2022   Lab Results  Component Value Date   NA 136 07/20/2022   K 4.4 07/20/2022   CO2 25 07/20/2022   GLUCOSE 95 07/20/2022   BUN 23 07/20/2022   CREATININE 0.78 07/20/2022   BILITOT 0.6 07/20/2022   ALKPHOS 47 07/20/2022   AST 17 07/20/2022   ALT 15 07/20/2022   PROT 7.0 07/20/2022   ALBUMIN 4.3 07/20/2022   CALCIUM 9.2 07/20/2022   GFR 91.53 07/20/2022   Lab Results  Component Value Date   CHOL 203 (H) 07/20/2022   Lab Results  Component Value Date   HDL 41.90 07/20/2022   Lab Results  Component Value Date   LDLCALC 136 (H) 07/20/2022   Lab Results  Component Value Date   TRIG 124.0 07/20/2022   Lab Results  Component Value Date   CHOLHDL 5 07/20/2022   Lab  Results  Component  Value Date   HGBA1C 5.2 08/30/2018      Assessment & Plan:  Rash Assessment & Plan: Will treat with valtrex 1000 mg twice a day. Use antihistamine for itching as needed.    Other orders -     valACYclovir HCl; Take 1 tablet (1,000 mg total) by mouth 2 (two) times daily for 10 days.  Dispense: 20 tablet; Refill: 0    Follow-up: Return if symptoms worsen or fail to improve.   Kara Dies, NP

## 2022-11-22 NOTE — Patient Instructions (Addendum)
Rx sent pharmacy  Shingles  Shingles, which is also known as herpes zoster, is an infection that causes a painful skin rash and fluid-filled blisters. It is caused by a virus. Shingles only develops in people who: Have had chickenpox. Have been vaccinated against chickenpox. Shingles is rare in this group. What are the causes? Shingles is caused by varicella-zoster virus. This is the same virus that causes chickenpox. After a person is exposed to the virus, it stays in the body in an inactive (dormant) state. Shingles develops if the virus is reactivated. This can happen many years after the first (initial) exposure to the virus. It is not known what causes this virus to be reactivated. What increases the risk? People who have had chickenpox or received the chickenpox vaccine are at risk for shingles. Shingles infection is more common in people who: Are older than 46 years of age. Have a weakened disease-fighting system (immune system), such as people with: HIV (human immunodeficiency virus). AIDS (acquired immunodeficiency syndrome). Cancer. Are taking medicines that weaken the immune system, such as organ transplant medicines. Are experiencing a lot of stress. What are the signs or symptoms? Early symptoms of this condition include itching, tingling, and pain in an area on your skin. Pain may be described as burning, stabbing, or throbbing. A few days or weeks after early symptoms start, a painful red rash appears. The rash is usually on one side of the body and has a band-like or belt-like pattern. The rash eventually turns into fluid-filled blisters that break open, change into scabs, and dry up in about 2-3 weeks. At any time during the infection, you may also develop: A fever. Chills. A headache. Nausea. How is this diagnosed? This condition is diagnosed with a skin exam. Skin or fluid samples (a culture) may be taken from the blisters before a diagnosis is made. How is this  treated? The rash may last for several weeks. There is not a specific cure for this condition. Your health care provider may prescribe medicines to help you manage pain, recover more quickly, and avoid long-term problems. Medicines may include: Antiviral medicines. Anti-inflammatory medicines. Pain medicines. Anti-itching medicines (antihistamines). If the area involved is on your face, you may be referred to a specialist, such as an eye doctor (ophthalmologist) or an ear, nose, and throat (ENT) doctor (otorhinolaryngologist) to help you avoid eye problems, chronic pain, or disability. Follow these instructions at home: Medicines Take over-the-counter and prescription medicines only as told by your health care provider. Apply an anti-itch cream or numbing cream to the affected area as told by your health care provider. Relieving itching and discomfort  Apply cold, wet cloths (cold compresses) to the area of the rash or blisters as told by your health care provider. Cool baths can be soothing. Try adding baking soda or dry oatmeal to the water to reduce itching. Do not bathe in hot water. Use calamine lotion as recommended by your health care provider. This is an over-the-counter lotion that helps to relieve itchiness. Blister and rash care Keep your rash covered with a loose bandage (dressing). Wear loose-fitting clothing to help ease the pain of material rubbing against the rash. Wash your hands with soap and water for at least 20 seconds before and after you change your dressing. If soap and water are not available, use hand sanitizer. Change your dressing as told by your health care provider. Keep your rash and blisters clean by washing the area with mild soap and cool water  as told by your health care provider. Check your rash every day for signs of infection. Check for: More redness, swelling, or pain. Fluid or blood. Warmth. Pus or a bad smell. Do not scratch your rash or pick at your  blisters. To help avoid scratching: Keep your fingernails clean and cut short. Wear gloves or mittens while you sleep, if scratching is a problem. General instructions Rest as told by your health care provider. Wash your hands often with soap and water for at least 20 seconds. If soap and water are not available, use hand sanitizer. Doing this lowers your chance of getting a bacterial skin infection. Before your blisters change into scabs, your shingles infection can cause chickenpox in people who have never had it or have never been vaccinated against it. To prevent this from happening, avoid contact with other people, especially: Babies. Pregnant women. Children who have eczema. Older people who have transplants. People who have chronic illnesses, such as cancer or AIDS. Keep all follow-up visits. This is important. How is this prevented? Getting vaccinated is the best way to prevent shingles and protect against shingles complications. If you have not been vaccinated, talk with your health care provider about getting the vaccine. Where to find more information Centers for Disease Control and Prevention: [employer] Contact a health care provider if: Your pain is not relieved with prescribed medicines. Your pain does not get better after the rash heals. You have any of these signs of infection: More redness, swelling, or pain around the rash. Fluid or blood coming from the rash. Warmth coming from your rash. Pus or a bad smell coming from the rash. A fever. Get help right away if: The rash is on your face or nose. You have facial pain, pain around your eye area, or loss of feeling on one side of your face. You have difficulty seeing. You have ear pain or have ringing in your ear. You have a loss of taste. Your condition gets worse. Summary Shingles, also known as herpes zoster, is an infection that causes a painful skin rash and fluid-filled blisters. This condition is diagnosed  with a skin exam. Skin or fluid samples (a culture) may be taken from the blisters. Keep your rash covered with a loose bandage (dressing). Wear loose-fitting clothing to help ease the pain of material rubbing against the rash. Before your blisters change into scabs, your shingles infection can cause chickenpox in people who have never had it or have never been vaccinated against it. This information is not intended to replace advice given to you by your health care provider. Make sure you discuss any questions you have with your health care provider. Document Revised: 06/08/2020 Document Reviewed: 06/08/2020 Elsevier Patient Education  2023 [employer]. .

## 2022-11-22 NOTE — Telephone Encounter (Signed)
Patient has been scheduled with Evelene Croon this PM

## 2022-11-24 ENCOUNTER — Encounter: Payer: Self-pay | Admitting: Internal Medicine

## 2022-11-24 ENCOUNTER — Ambulatory Visit: Payer: BC Managed Care – PPO | Admitting: Internal Medicine

## 2022-11-24 VITALS — BP 128/72 | HR 70 | Temp 98.0°F | Resp 16 | Ht 61.0 in | Wt 172.0 lb

## 2022-11-24 DIAGNOSIS — F439 Reaction to severe stress, unspecified: Secondary | ICD-10-CM | POA: Diagnosis not present

## 2022-11-24 DIAGNOSIS — E7800 Pure hypercholesterolemia, unspecified: Secondary | ICD-10-CM | POA: Diagnosis not present

## 2022-11-24 DIAGNOSIS — Z1231 Encounter for screening mammogram for malignant neoplasm of breast: Secondary | ICD-10-CM

## 2022-11-24 DIAGNOSIS — Z124 Encounter for screening for malignant neoplasm of cervix: Secondary | ICD-10-CM

## 2022-11-24 DIAGNOSIS — E559 Vitamin D deficiency, unspecified: Secondary | ICD-10-CM

## 2022-11-24 DIAGNOSIS — I10 Essential (primary) hypertension: Secondary | ICD-10-CM | POA: Diagnosis not present

## 2022-11-24 DIAGNOSIS — B029 Zoster without complications: Secondary | ICD-10-CM

## 2022-11-24 NOTE — Progress Notes (Signed)
Subjective:    Patient ID: Cassandra Greene, female    DOB: February 15, 1976, 47 y.o.   MRN: [id number]  Patient here for  Chief Complaint  Patient presents with   Medical Management of Chronic Issues    HPI Here to follow up regarding hypercholesterolemia and hypertension.  Was having issues with dizziness.  Saw ENT.  Vestibular therapy - did not make a difference in initially.  Reports now symptoms have improved.  Recently evaluated for painful rash.  Diagnosed with shingles.  On valtrex.  Taking ibuprofen and tylenol for pain.  Controlling.  No chest pain or sob reported.  No abdominal pain or bowel change reported.  Blood pressures averaging 120s/70s.  Is scheduled to see GI in July.     Past Medical History:  Diagnosis Date   Allergy    Asthma    Fatigue    GERD (gastroesophageal reflux disease)    Heavy periods    Hyperlipidemia    Painful menstrual periods    Vitamin D deficiency    Yeast infection of the vagina    History reviewed. No pertinent surgical history. Family History  Problem Relation Age of Onset   Thyroid disease Mother    Hypertension Mother    Cancer Mother    Hyperlipidemia Mother    Thyroid disease Father    Hypertension Father    Cancer Father    Hyperlipidemia Father    Hearing loss Father    Thyroid disease Maternal Aunt    Thyroid disease Maternal Grandmother    Hypertension Maternal Grandmother    Heart disease Maternal Grandmother    Thyroid disease Paternal Grandmother    Hypertension Brother    Kidney disease Brother    Alcohol abuse Maternal Grandfather    Stroke Maternal Grandfather    Heart disease Maternal Grandfather    Hearing loss Paternal Grandfather    Hypertension Paternal Grandfather    Hyperlipidemia Paternal Grandfather    Breast cancer Neg Hx    Social History   Socioeconomic History   Marital status: Married    Spouse name: Not on file   Number of children: 3   Years of education: Not on file   Highest education  level: Bachelor's degree (e.g., BA, AB, BS)  Occupational History   Not on file  Tobacco Use   Smoking status: Never   Smokeless tobacco: Never  Vaping Use   Vaping Use: Never used  Substance and Sexual Activity   Alcohol use: Yes   Drug use: No   Sexual activity: Yes    Birth control/protection: I.U.D.    Comment: mirena  Other Topics Concern   Not on file  Social History Narrative   Not on file   Social Determinants of Health   Financial Resource Strain: Low Risk  (01/22/2021)   Overall Financial Resource Strain (CARDIA)    Difficulty of Paying Living Expenses: Not hard at all  Food Insecurity: Not on file  Transportation Needs: Not on file  Physical Activity: Not on file  Stress: Not on file  Social Connections: Not on file     Review of Systems  Constitutional:  Negative for appetite change and unexpected weight change.  HENT:  Negative for congestion and sinus pressure.   Respiratory:  Negative for cough, chest tightness and shortness of breath.   Cardiovascular:  Negative for chest pain, palpitations and leg swelling.  Gastrointestinal:  Negative for abdominal pain, diarrhea, nausea and vomiting.  Genitourinary:  Negative for difficulty urinating  and dysuria.  Musculoskeletal:  Negative for joint swelling and myalgias.  Skin:        Rash as outlined.  C/w shingles.   Neurological:  Negative for dizziness and headaches.  Psychiatric/Behavioral:  Negative for agitation and dysphoric mood.        Objective:     BP 128/72   Pulse 70   Temp 98 F (36.7 C)   Resp 16   Ht 5\' 1"  (1.549 m)   Wt 172 lb (78 kg)   SpO2 98%   BMI 32.50 kg/m  Wt Readings from Last 3 Encounters:  11/24/22 172 lb (78 kg)  11/22/22 172 lb (78 kg)  07/20/22 170 lb (77.1 kg)    Physical Exam Vitals reviewed.  Constitutional:      General: She is not in acute distress.    Appearance: Normal appearance.  HENT:     Head: Normocephalic and atraumatic.     Right Ear: External ear  normal.     Left Ear: External ear normal.  Eyes:     General: No scleral icterus.       Right eye: No discharge.        Left eye: No discharge.     Conjunctiva/sclera: Conjunctivae normal.  Neck:     Thyroid: No thyromegaly.  Cardiovascular:     Rate and Rhythm: Normal rate and regular rhythm.  Pulmonary:     Effort: No respiratory distress.     Breath sounds: Normal breath sounds. No wheezing.  Abdominal:     General: Bowel sounds are normal.     Palpations: Abdomen is soft.     Tenderness: There is no abdominal tenderness.  Musculoskeletal:        General: No swelling or tenderness.     Cervical back: Neck supple. No tenderness.  Lymphadenopathy:     Cervical: No cervical adenopathy.  Skin:    Comments: Erythematous based rash with some vesicles - appears to be c/w shingles.   Neurological:     Mental Status: She is alert.  Psychiatric:        Mood and Affect: Mood normal.        Behavior: Behavior normal.      Outpatient Encounter Medications as of 11/24/2022  Medication Sig   carvedilol (COREG) 6.25 MG tablet TAKE ONE TABLET BY MOUTH TWICE DAILY WITH A MEAL   ibuprofen (ADVIL,MOTRIN) 200 MG tablet Take 200 mg by mouth every 6 (six) hours as needed.   lansoprazole (PREVACID) 15 MG capsule Take 15 mg by mouth daily.   levonorgestrel (MIRENA) 20 MCG/24HR IUD 1 each by Intrauterine route once.   magnesium oxide (MAG-OX) 400 MG tablet TAKE 1 TABLET BY MOUTH DAILY   valACYclovir (VALTREX) 1000 MG tablet Take 1 tablet (1,000 mg total) by mouth 2 (two) times daily for 10 days.   [DISCONTINUED] Vitamin D, Ergocalciferol, (DRISDOL) 1.25 MG (50000 UNIT) CAPS capsule TAKE 1 CAPSULE BY MOUTH TWICE PER WEEK   No facility-administered encounter medications on file as of 11/24/2022.     Lab Results  Component Value Date   WBC 5.4 07/20/2022   HGB 14.2 07/20/2022   HCT 41.5 07/20/2022   PLT 283.0 07/20/2022   GLUCOSE 95 07/20/2022   CHOL 203 (H) 07/20/2022   TRIG 124.0  07/20/2022   HDL 41.90 07/20/2022   LDLCALC 136 (H) 07/20/2022   ALT 15 07/20/2022   AST 17 07/20/2022   NA 136 07/20/2022   K 4.4 07/20/2022   CL  103 07/20/2022   CREATININE 0.78 07/20/2022   BUN 23 07/20/2022   CO2 25 07/20/2022   TSH 2.78 07/20/2022   HGBA1C 5.2 08/30/2018    MM DIAG BREAST TOMO BILATERAL  Result Date: 06/16/2022 CLINICAL DATA:  One year follow-up of probably benign right breast mass. EXAM: DIGITAL DIAGNOSTIC BILATERAL MAMMOGRAM WITH TOMOSYNTHESIS; ULTRASOUND RIGHT BREAST LIMITED TECHNIQUE: Bilateral digital diagnostic mammography and breast tomosynthesis was performed.; Targeted ultrasound examination of the right breast was performed COMPARISON:  Previous exam(s). ACR Breast Density Category b: There are scattered areas of fibroglandular density. FINDINGS: Mammographically, there are no new suspicious masses, areas of architectural distortion or microcalcifications in either breast. There is a stable circumscribed mass in the outer upper right breast, middle depth. The finding is better seen on the craniocaudal view. Targeted right breast ultrasound is performed in demonstrates 9 o'clock 7 cm from nipple stable cluster of probably benign cysts measuring 1.7 x 0.4 x 1.0 cm. IMPRESSION: Stable right breast 9 o'clock probably benign cluster of cysts. No mammographic evidence of left breast malignancy. RECOMMENDATION: Recommend bilateral diagnostic mammogram and focused right breast ultrasound in 1 year. I have discussed the findings and recommendations with the patient. If applicable, a reminder letter will be sent to the patient regarding the next appointment. BI-RADS CATEGORY  3: Probably benign. Electronically Signed   By: Ted Mcalpine M.D.   On: 06/16/2022 11:42  US BREAST LTD UNI RIGHT INC AXILLA  Result Date: 06/16/2022 CLINICAL DATA:  One year follow-up of probably benign right breast mass. EXAM: DIGITAL DIAGNOSTIC BILATERAL MAMMOGRAM WITH TOMOSYNTHESIS;  ULTRASOUND RIGHT BREAST LIMITED TECHNIQUE: Bilateral digital diagnostic mammography and breast tomosynthesis was performed.; Targeted ultrasound examination of the right breast was performed COMPARISON:  Previous exam(s). ACR Breast Density Category b: There are scattered areas of fibroglandular density. FINDINGS: Mammographically, there are no new suspicious masses, areas of architectural distortion or microcalcifications in either breast. There is a stable circumscribed mass in the outer upper right breast, middle depth. The finding is better seen on the craniocaudal view. Targeted right breast ultrasound is performed in demonstrates 9 o'clock 7 cm from nipple stable cluster of probably benign cysts measuring 1.7 x 0.4 x 1.0 cm. IMPRESSION: Stable right breast 9 o'clock probably benign cluster of cysts. No mammographic evidence of left breast malignancy. RECOMMENDATION: Recommend bilateral diagnostic mammogram and focused right breast ultrasound in 1 year. I have discussed the findings and recommendations with the patient. If applicable, a reminder letter will be sent to the patient regarding the next appointment. BI-RADS CATEGORY  3: Probably benign. Electronically Signed   By: Ted Mcalpine M.D.   On: 06/16/2022 11:42      Assessment & Plan:  Hypertension, essential Assessment & Plan: On carvedilol.  Blood pressure as outlined.  No changes in medication.  Follow.   Orders: -     Basic metabolic panel; Future  Hypercholesterolemia Assessment & Plan: The 10-year ASCVD risk score (Arnett DK, et al., 2019) is: 1.8%   Values used to calculate the score:     Age: 47 years     Sex: Female     Is Non-Hispanic African American: No     Diabetic: No     Tobacco smoker: No     Systolic Blood Pressure: 128 mmHg     Is BP treated: Yes     HDL Cholesterol: 41.9 mg/dL     Total Cholesterol: 203 mg/dL  Low cholesterol diet and exercise.  Follow lipid panel.   Orders: -  Hepatic function  panel; Future -     Lipid panel; Future  Vitamin D deficiency -     VITAMIN D 25 Hydroxy (Vit-D Deficiency, Fractures); Future  Encounter for screening mammogram for malignant neoplasm of breast Assessment & Plan: Mammogram - 05/2022 - Recommend bilateral diagnostic mammogram and focused right breast ultrasound in 1 year.   Cervical cancer screening Assessment & Plan: Continues f/u Dr Dalbert Garnet.     Stress Assessment & Plan: Overall appears to be handling things relatively well.  Follow. Will notify me if feels needs any further intervention.    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  Controlling pain with ibuprofen and tylenol.  Instructed on contact precautions.  Out of work rest of this week.       Dale , MD

## 2022-11-28 ENCOUNTER — Encounter: Payer: Self-pay | Admitting: Internal Medicine

## 2022-11-28 DIAGNOSIS — B029 Zoster without complications: Secondary | ICD-10-CM | POA: Insufficient documentation

## 2022-11-28 NOTE — Assessment & Plan Note (Signed)
On valtrex.  Controlling pain with ibuprofen and tylenol.  Instructed on contact precautions.  Out of work rest of this week.

## 2022-11-28 NOTE — Assessment & Plan Note (Signed)
On carvedilol.  Blood pressure as outlined.  No changes in medication.  Follow.  

## 2022-11-28 NOTE — Assessment & Plan Note (Signed)
Continues f/u Dr Beasley.   

## 2022-11-28 NOTE — Assessment & Plan Note (Signed)
Mammogram - 05/2022 - Recommend bilateral diagnostic mammogram and focused right breast ultrasound in 1 year.

## 2022-11-28 NOTE — Assessment & Plan Note (Signed)
Overall appears to be handling things relatively well.  Follow. Will notify me if feels needs any further intervention.  

## 2022-11-28 NOTE — Assessment & Plan Note (Signed)
The 10-year ASCVD risk score (Arnett DK, et al., 2019) is: 1.8%   Values used to calculate the score:     Age: 47 years     Sex: Female     Is Non-Hispanic African American: No     Diabetic: No     Tobacco smoker: No     Systolic Blood Pressure: 128 mmHg     Is BP treated: Yes     HDL Cholesterol: 41.9 mg/dL     Total Cholesterol: 203 mg/dL  Low cholesterol diet and exercise.  Follow lipid panel.

## 2022-12-01 NOTE — Assessment & Plan Note (Signed)
Will treat with valtrex 1000 mg twice a day. Use antihistamine for itching as needed.

## 2023-02-13 ENCOUNTER — Encounter: Payer: Self-pay | Admitting: Internal Medicine

## 2023-02-13 DIAGNOSIS — R300 Dysuria: Secondary | ICD-10-CM

## 2023-02-14 NOTE — Telephone Encounter (Signed)
Pt called in stating that she has an UTI and its unable to come in today for an appt due to work. Pt stated if they can sent something in to her pharmacy?

## 2023-02-14 NOTE — Telephone Encounter (Signed)
I have placed the orders. Pt will come by the office at 7:30 in the morning to give the urine sample. I have also scheduled pt for a virtual visit with you tomorrow afternoon at 4:00. Pt was advised to try AZO until we can get the results. Pt is stated that she will try but she is in quite a bit of discomfort.

## 2023-02-15 ENCOUNTER — Other Ambulatory Visit (INDEPENDENT_AMBULATORY_CARE_PROVIDER_SITE_OTHER): Payer: BC Managed Care – PPO

## 2023-02-15 ENCOUNTER — Encounter: Payer: Self-pay | Admitting: Family Medicine

## 2023-02-15 ENCOUNTER — Telehealth: Payer: BC Managed Care – PPO | Admitting: Family Medicine

## 2023-02-15 VITALS — Ht 61.0 in | Wt 172.0 lb

## 2023-02-15 DIAGNOSIS — R350 Frequency of micturition: Secondary | ICD-10-CM | POA: Diagnosis not present

## 2023-02-15 DIAGNOSIS — R300 Dysuria: Secondary | ICD-10-CM | POA: Diagnosis not present

## 2023-02-15 LAB — URINALYSIS, MICROSCOPIC ONLY

## 2023-02-15 LAB — POCT URINALYSIS DIPSTICK
Bilirubin, UA: NEGATIVE
Glucose, UA: NEGATIVE
Ketones, UA: NEGATIVE
Leukocytes, UA: NEGATIVE
Nitrite, UA: NEGATIVE
Protein, UA: NEGATIVE
Spec Grav, UA: 1.025 (ref 1.010–1.025)
Urobilinogen, UA: 0.2 E.U./dL
pH, UA: 6 (ref 5.0–8.0)

## 2023-02-15 NOTE — Telephone Encounter (Signed)
Pt has an appointment today at 4 pm with Dr. Clent Ridges

## 2023-02-15 NOTE — Assessment & Plan Note (Signed)
Improving symptoms.  Exam limited given nature of visit. POC urine negative for Leuks, Nitrates.  Small amount RBC Wait for culture to return Will have patient strain urine If any worsening symptoms plan for office visit and possible CT renal study. Continue to hydrate well

## 2023-02-15 NOTE — Patient Instructions (Addendum)
It was a pleasure meeting you today. Thank you for allowing me to take part in your health care.  Our goals for today as we discussed include:  Urine is negative for infection.  Shows trace blood. Possible that you passed a stone Will wait for culture to confirm Can strain urine to see if any stones pass  Follow up as needed   If you have any questions or concerns, please do not hesitate to call the office at [phone].  I look forward to our next visit and until then take care and stay safe.  Regards,   Dana Allan, MD   [facility]

## 2023-02-15 NOTE — Progress Notes (Signed)
Virtual Visit via Video note  I connected with [name] on 02/15/23 at 1626 by video and verified that I am speaking with the correct person using two identifiers. Cassandra Greene is currently located at home and  is currently with her during visit. The provider, Dana Allan, MD is located in their office at time of visit.  I discussed the limitations, risks, security and privacy concerns of performing an evaluation and management service by video and the availability of in person appointments. I also discussed with the patient that there may be a patient responsible charge related to this service. The patient expressed understanding and agreed to proceed.  Subjective: PCP: Dale Hayfield, MD  Chief Complaint  Patient presents with   Urinary Tract Infection    Since Friday going to the bathroom. Saturday had a odor and woke up in the am to go to the bathroom. Monday the pain was worse.    Urinary Tract Infection   Symptoms started Friday.  Foul smelling odor, burning sensation when urinating and progressively having increased urinary frequency. Took 2 doses of Doxycycline and symptoms seemed to have improved.  Denies any back pain, vaginal discharge, fever, abdominal pain, hematuria.  Hydrating well but reports had not been as good last week. Reports that symptoms have improved but still having a slight tinge of discomfort with urination.  Dropped of urine this morning.  ROS: Per HPI  Current Outpatient Medications:    carvedilol (COREG) 6.25 MG tablet, TAKE ONE TABLET BY MOUTH TWICE DAILY WITH A MEAL, Disp: 180 tablet, Rfl: 1   ibuprofen (ADVIL,MOTRIN) 200 MG tablet, Take 200 mg by mouth every 6 (six) hours as needed., Disp: , Rfl:    lansoprazole (PREVACID) 15 MG capsule, Take 15 mg by mouth daily., Disp: , Rfl:    levonorgestrel (MIRENA) 20 MCG/24HR IUD, 1 each by Intrauterine route once., Disp: , Rfl:    magnesium oxide (MAG-OX) 400 MG tablet, TAKE 1 TABLET BY MOUTH  DAILY, Disp: 30 tablet, Rfl: 1  Observations/Objective: Physical Exam Pulmonary:     Effort: Pulmonary effort is normal.  Neurological:     Mental Status: She is alert and oriented to person, place, and time. Mental status is at baseline.  Psychiatric:        Mood and Affect: Mood normal.        Behavior: Behavior normal.        Thought Content: Thought content normal.        Judgment: Judgment normal.    Assessment and Plan: Urinary frequency Assessment & Plan: Improving symptoms.  Exam limited given nature of visit. POC urine negative for Leuks, Nitrates.  Small amount RBC Wait for culture to return Will have patient strain urine If any worsening symptoms plan for office visit and possible CT renal study. Continue to hydrate well     Follow Up Instructions: Return if symptoms worsen or fail to improve, for PCP.   I discussed the assessment and treatment plan with the patient. The patient was provided an opportunity to ask questions and all were answered. The patient agreed with the plan and demonstrated an understanding of the instructions.   The patient was advised to call back or seek an in-person evaluation if the symptoms worsen or if the condition fails to improve as anticipated.  The above assessment and management plan was discussed with the patient. The patient verbalized understanding of and has agreed to the management plan. Patient is aware to call the  clinic if symptoms persist or worsen. Patient is aware when to return to the clinic for a follow-up visit. Patient educated on when it is appropriate to go to the emergency department.     Dana Allan, MD

## 2023-02-17 ENCOUNTER — Other Ambulatory Visit: Payer: Self-pay | Admitting: Family Medicine

## 2023-02-17 DIAGNOSIS — N3000 Acute cystitis without hematuria: Secondary | ICD-10-CM

## 2023-02-17 LAB — URINE CULTURE
MICRO NUMBER:: 15362107
SPECIMEN QUALITY:: ADEQUATE

## 2023-02-17 MED ORDER — CEFDINIR 300 MG PO CAPS
300.0000 mg | ORAL_CAPSULE | Freq: Two times a day (BID) | ORAL | 0 refills | Status: AC
Start: 2023-02-17 — End: 2023-02-22

## 2023-02-17 MED ORDER — SACCHAROMYCES BOULARDII 250 MG PO CAPS: 250.0000 mg | ORAL_CAPSULE | Freq: Every day | ORAL | 0 refills | Status: DC

## 2023-03-29 ENCOUNTER — Encounter: Payer: Self-pay | Admitting: Internal Medicine

## 2023-03-29 ENCOUNTER — Ambulatory Visit: Payer: BC Managed Care – PPO | Admitting: Internal Medicine

## 2023-03-29 VITALS — BP 126/78 | HR 60 | Temp 98.0°F | Resp 16 | Ht 61.0 in | Wt 170.0 lb

## 2023-03-29 DIAGNOSIS — E7800 Pure hypercholesterolemia, unspecified: Secondary | ICD-10-CM

## 2023-03-29 DIAGNOSIS — R319 Hematuria, unspecified: Secondary | ICD-10-CM

## 2023-03-29 DIAGNOSIS — F439 Reaction to severe stress, unspecified: Secondary | ICD-10-CM

## 2023-03-29 DIAGNOSIS — Z1231 Encounter for screening mammogram for malignant neoplasm of breast: Secondary | ICD-10-CM

## 2023-03-29 DIAGNOSIS — Z975 Presence of (intrauterine) contraceptive device: Secondary | ICD-10-CM

## 2023-03-29 DIAGNOSIS — E559 Vitamin D deficiency, unspecified: Secondary | ICD-10-CM

## 2023-03-29 DIAGNOSIS — I10 Essential (primary) hypertension: Secondary | ICD-10-CM | POA: Diagnosis not present

## 2023-03-29 DIAGNOSIS — R519 Headache, unspecified: Secondary | ICD-10-CM

## 2023-03-29 LAB — CBC WITH DIFFERENTIAL/PLATELET
Basophils Absolute: 0.1 10*3/uL (ref 0.0–0.1)
Basophils Relative: 1.1 % (ref 0.0–3.0)
Eosinophils Absolute: 0.2 10*3/uL (ref 0.0–0.7)
Eosinophils Relative: 3.1 % (ref 0.0–5.0)
HCT: 43.9 % (ref 36.0–46.0)
Hemoglobin: 14.4 g/dL (ref 12.0–15.0)
Lymphocytes Relative: 42.6 % (ref 12.0–46.0)
Lymphs Abs: 2.3 10*3/uL (ref 0.7–4.0)
MCHC: 32.9 g/dL (ref 30.0–36.0)
MCV: 90.4 fL (ref 78.0–100.0)
Monocytes Absolute: 0.3 10*3/uL (ref 0.1–1.0)
Monocytes Relative: 6.2 % (ref 3.0–12.0)
Neutro Abs: 2.6 10*3/uL (ref 1.4–7.7)
Neutrophils Relative %: 47 % (ref 43.0–77.0)
Platelets: 272 10*3/uL (ref 150.0–400.0)
RBC: 4.85 Mil/uL (ref 3.87–5.11)
RDW: 12.9 % (ref 11.5–15.5)
WBC: 5.4 10*3/uL (ref 4.0–10.5)

## 2023-03-29 LAB — LIPID PANEL
Cholesterol: 267 mg/dL — ABNORMAL HIGH (ref 0–200)
HDL: 47.5 mg/dL (ref >39.00)
LDL Cholesterol: 178 mg/dL — ABNORMAL HIGH (ref 0–99)
NonHDL: 219.54
Total CHOL/HDL Ratio: 6
Triglycerides: 206 mg/dL — ABNORMAL HIGH (ref 0.0–149.0)
VLDL: 41.2 mg/dL — ABNORMAL HIGH (ref 0.0–40.0)

## 2023-03-29 LAB — URINALYSIS, ROUTINE W REFLEX MICROSCOPIC
Bilirubin Urine: NEGATIVE
Ketones, ur: NEGATIVE
Leukocytes,Ua: NEGATIVE
Nitrite: NEGATIVE
Specific Gravity, Urine: 1.02 (ref 1.000–1.030)
Total Protein, Urine: NEGATIVE
Urine Glucose: NEGATIVE
Urobilinogen, UA: 0.2 (ref 0.0–1.0)
pH: 6.5 (ref 5.0–8.0)

## 2023-03-29 LAB — HEPATIC FUNCTION PANEL
ALT: 16 U/L (ref 0–35)
AST: 16 U/L (ref 0–37)
Albumin: 4.3 g/dL (ref 3.5–5.2)
Alkaline Phosphatase: 58 U/L (ref 39–117)
Bilirubin, Direct: 0.1 mg/dL (ref 0.0–0.3)
Total Bilirubin: 1 mg/dL (ref 0.2–1.2)
Total Protein: 6.6 g/dL (ref 6.0–8.3)

## 2023-03-29 LAB — HEMOGLOBIN A1C: Hgb A1c MFr Bld: 5.3 % (ref 4.6–6.5)

## 2023-03-29 LAB — BASIC METABOLIC PANEL
BUN: 23 mg/dL (ref 6–23)
CO2: 24 meq/L (ref 19–32)
Calcium: 9.1 mg/dL (ref 8.4–10.5)
Chloride: 107 meq/L (ref 96–112)
Creatinine, Ser: 0.82 mg/dL (ref 0.40–1.20)
GFR: 85.78 mL/min (ref >60.00)
Glucose, Bld: 96 mg/dL (ref 70–99)
Potassium: 4.3 meq/L (ref 3.5–5.1)
Sodium: 139 meq/L (ref 135–145)

## 2023-03-29 LAB — VITAMIN D 25 HYDROXY (VIT D DEFICIENCY, FRACTURES): VITD: 31.83 ng/mL (ref 30.00–100.00)

## 2023-03-29 LAB — TSH: TSH: 2.19 u[IU]/mL (ref 0.35–5.50)

## 2023-03-29 NOTE — Assessment & Plan Note (Signed)
Follow vitamin D level.  

## 2023-03-29 NOTE — Assessment & Plan Note (Signed)
Due f/u with gyn.  Plans to call to reschedule f/u.

## 2023-03-29 NOTE — Assessment & Plan Note (Signed)
Has a history of migraine headaches.  On magnesium.  Discussed sleep and hormone changes.  Discussed stress.  Occasional headaches recently. Eye appt 04/11/23.

## 2023-03-29 NOTE — Assessment & Plan Note (Signed)
Recent hematuria.  Treated for UTI.  Recheck urinalysis today to confirm clear.

## 2023-03-29 NOTE — Assessment & Plan Note (Signed)
On carvedilol.  Blood pressure as outlined.  No changes in medication.  Follow.  

## 2023-03-29 NOTE — Assessment & Plan Note (Signed)
The 10-year ASCVD risk score (Arnett DK, et al., 2019) is: 1.8%   Values used to calculate the score:     Age: 47 years     Sex: Female     Is Non-Hispanic African American: No     Diabetic: No     Tobacco smoker: No     Systolic Blood Pressure: 126 mmHg     Is BP treated: Yes     HDL Cholesterol: 41.9 mg/dL     Total Cholesterol: 203 mg/dL  Low cholesterol diet and exercise.  Follow lipid panel.

## 2023-03-29 NOTE — Assessment & Plan Note (Signed)
Overall appears to be handling things relatively well.  Follow. Will notify me if feels needs any further intervention.

## 2023-03-29 NOTE — Progress Notes (Signed)
Subjective:    Patient ID: Cassandra Greene, female    DOB: [birth year], 47 y.o.   MRN: [id number]  Patient here for  Chief Complaint  Patient presents with   Medical Management of Chronic Issues    HPI Here to follow up regarding hypercholesterolemia and hypertension.  Reports increased stress.  Discussed.  Some occasional headaches.  Has a history of migraines.  Some change in vision. No acute vision loss.  Some blurring of vision and "seeing what appears to be blood vessels" in her eyes.  Has an appt with ophthalmology 04/11/23.  No eye pain reported.  No chest pain or sob reported.  Did recently have what she felt was a kidney stone.  Increased urinary symptoms.  Treated for UTI. Noticed blood in urine.  Symptoms lasted 8 days.  No blood since.  Had IUD.  Some intermittent spotting.  Planning to see gyn.  Discussed sleep.  Does not feel rested.  Some fatigue.  Discussed possible sleep apnea. Discussed diet and exercise.    Past Medical History:  Diagnosis Date   Allergy    Asthma    Fatigue    GERD (gastroesophageal reflux disease)    Heavy periods    Hyperlipidemia    Painful menstrual periods    Vitamin D deficiency    Yeast infection of the vagina    History reviewed. No pertinent surgical history. Family History  Problem Relation Age of Onset   Thyroid disease Mother    Hypertension Mother    Cancer Mother    Hyperlipidemia Mother    Thyroid disease Father    Hypertension Father    Cancer Father    Hyperlipidemia Father    Hearing loss Father    Thyroid disease Maternal Aunt    Thyroid disease Maternal Grandmother    Hypertension Maternal Grandmother    Heart disease Maternal Grandmother    Thyroid disease Paternal Grandmother    Hypertension Brother    Kidney disease Brother    Alcohol abuse Maternal Grandfather    Stroke Maternal Grandfather    Heart disease Maternal Grandfather    Hearing loss Paternal Grandfather    Hypertension Paternal Grandfather     Hyperlipidemia Paternal Grandfather    Breast cancer Neg Hx    Social History   Socioeconomic History   Marital status: Married    Spouse name: Not on file   Number of children: 3   Years of education: Not on file   Highest education level: Bachelor's degree (e.g., BA, AB, BS)  Occupational History   Not on file  Tobacco Use   Smoking status: Never   Smokeless tobacco: Never  Vaping Use   Vaping status: Never Used  Substance and Sexual Activity   Alcohol use: Yes   Drug use: No   Sexual activity: Yes    Birth control/protection: I.U.D.    Comment: mirena  Other Topics Concern   Not on file  Social History Narrative   Not on file   Social Determinants of Health   Financial Resource Strain: Low Risk  (01/22/2021)   Overall Financial Resource Strain (CARDIA)    Difficulty of Paying Living Expenses: Not hard at all  Food Insecurity: Not on file  Transportation Needs: Not on file  Physical Activity: Not on file  Stress: Not on file  Social Connections: Not on file     Review of Systems  Constitutional:  Positive for fatigue. Negative for appetite change and unexpected weight change.  HENT:  Negative for congestion and sinus pressure.   Respiratory:  Negative for cough, chest tightness and shortness of breath.   Cardiovascular:  Negative for chest pain, palpitations and leg swelling.  Gastrointestinal:  Negative for abdominal pain, diarrhea, nausea and vomiting.  Genitourinary:  Negative for difficulty urinating and dysuria.       Previous hematuria.   Musculoskeletal:  Negative for joint swelling and myalgias.  Skin:  Negative for color change and rash.  Neurological:  Negative for dizziness and headaches.  Psychiatric/Behavioral:  Negative for agitation and dysphoric mood.        Objective:     BP 126/78   Pulse 60   Temp 98 F (36.7 C)   Resp 16   Ht 5\' 1"  (1.549 m)   Wt 170 lb (77.1 kg)   SpO2 98%   BMI 32.12 kg/m  Wt Readings from Last 3 Encounters:   03/29/23 170 lb (77.1 kg)  02/15/23 172 lb (78 kg)  11/24/22 172 lb (78 kg)    Physical Exam Vitals reviewed.  Constitutional:      General: She is not in acute distress.    Appearance: Normal appearance.  HENT:     Head: Normocephalic and atraumatic.     Right Ear: External ear normal.     Left Ear: External ear normal.  Eyes:     General: No scleral icterus.       Right eye: No discharge.        Left eye: No discharge.     Conjunctiva/sclera: Conjunctivae normal.  Neck:     Thyroid: No thyromegaly.  Cardiovascular:     Rate and Rhythm: Normal rate and regular rhythm.  Pulmonary:     Effort: No respiratory distress.     Breath sounds: Normal breath sounds. No wheezing.  Abdominal:     General: Bowel sounds are normal.     Palpations: Abdomen is soft.     Tenderness: There is no abdominal tenderness.  Musculoskeletal:        General: No swelling or tenderness.     Cervical back: Neck supple. No tenderness.  Lymphadenopathy:     Cervical: No cervical adenopathy.  Skin:    Findings: No erythema or rash.  Neurological:     Mental Status: She is alert.  Psychiatric:        Mood and Affect: Mood normal.        Behavior: Behavior normal.      Outpatient Encounter Medications as of 03/29/2023  Medication Sig   carvedilol (COREG) 6.25 MG tablet TAKE ONE TABLET BY MOUTH TWICE DAILY WITH A MEAL   ibuprofen (ADVIL,MOTRIN) 200 MG tablet Take 200 mg by mouth every 6 (six) hours as needed.   lansoprazole (PREVACID) 15 MG capsule Take 30 mg by mouth daily.   levonorgestrel (MIRENA) 20 MCG/24HR IUD 1 each by Intrauterine route once.   magnesium oxide (MAG-OX) 400 MG tablet TAKE 1 TABLET BY MOUTH DAILY   [DISCONTINUED] saccharomyces boulardii (FLORASTOR) 250 MG capsule Take 1 capsule (250 mg total) by mouth daily.   No facility-administered encounter medications on file as of 03/29/2023.     Lab Results  Component Value Date   WBC 5.4 07/20/2022   HGB 14.2 07/20/2022    HCT 41.5 07/20/2022   PLT 283.0 07/20/2022   GLUCOSE 95 07/20/2022   CHOL 203 (H) 07/20/2022   TRIG 124.0 07/20/2022   HDL 41.90 07/20/2022   LDLCALC 136 (H) 07/20/2022   ALT 15 07/20/2022  AST 17 07/20/2022   NA 136 07/20/2022   K 4.4 07/20/2022   CL 103 07/20/2022   CREATININE 0.78 07/20/2022   BUN 23 07/20/2022   CO2 25 07/20/2022   TSH 2.78 07/20/2022   HGBA1C 5.2 08/30/2018    MM DIAG BREAST TOMO BILATERAL  Result Date: 06/16/2022 CLINICAL DATA:  One year follow-up of probably benign right breast mass. EXAM: DIGITAL DIAGNOSTIC BILATERAL MAMMOGRAM WITH TOMOSYNTHESIS; ULTRASOUND RIGHT BREAST LIMITED TECHNIQUE: Bilateral digital diagnostic mammography and breast tomosynthesis was performed.; Targeted ultrasound examination of the right breast was performed COMPARISON:  Previous exam(s). ACR Breast Density Category b: There are scattered areas of fibroglandular density. FINDINGS: Mammographically, there are no new suspicious masses, areas of architectural distortion or microcalcifications in either breast. There is a stable circumscribed mass in the outer upper right breast, middle depth. The finding is better seen on the craniocaudal view. Targeted right breast ultrasound is performed in demonstrates 9 o'clock 7 cm from nipple stable cluster of probably benign cysts measuring 1.7 x 0.4 x 1.0 cm. IMPRESSION: Stable right breast 9 o'clock probably benign cluster of cysts. No mammographic evidence of left breast malignancy. RECOMMENDATION: Recommend bilateral diagnostic mammogram and focused right breast ultrasound in 1 year. I have discussed the findings and recommendations with the patient. If applicable, a reminder letter will be sent to the patient regarding the next appointment. BI-RADS CATEGORY  3: Probably benign. Electronically Signed   By: Ted Mcalpine M.D.   On: 06/16/2022 11:42  US BREAST LTD UNI RIGHT INC AXILLA  Result Date: 06/16/2022 CLINICAL DATA:  One year follow-up  of probably benign right breast mass. EXAM: DIGITAL DIAGNOSTIC BILATERAL MAMMOGRAM WITH TOMOSYNTHESIS; ULTRASOUND RIGHT BREAST LIMITED TECHNIQUE: Bilateral digital diagnostic mammography and breast tomosynthesis was performed.; Targeted ultrasound examination of the right breast was performed COMPARISON:  Previous exam(s). ACR Breast Density Category b: There are scattered areas of fibroglandular density. FINDINGS: Mammographically, there are no new suspicious masses, areas of architectural distortion or microcalcifications in either breast. There is a stable circumscribed mass in the outer upper right breast, middle depth. The finding is better seen on the craniocaudal view. Targeted right breast ultrasound is performed in demonstrates 9 o'clock 7 cm from nipple stable cluster of probably benign cysts measuring 1.7 x 0.4 x 1.0 cm. IMPRESSION: Stable right breast 9 o'clock probably benign cluster of cysts. No mammographic evidence of left breast malignancy. RECOMMENDATION: Recommend bilateral diagnostic mammogram and focused right breast ultrasound in 1 year. I have discussed the findings and recommendations with the patient. If applicable, a reminder letter will be sent to the patient regarding the next appointment. BI-RADS CATEGORY  3: Probably benign. Electronically Signed   By: Ted Mcalpine M.D.   On: 06/16/2022 11:42      Assessment & Plan:  Visit for screening mammogram -     3D Screening Mammogram, Left and Right; Future  Vitamin D deficiency Assessment & Plan: Follow vitamin D level.   Orders: -     VITAMIN D 25 Hydroxy (Vit-D Deficiency, Fractures)  Hypercholesterolemia Assessment & Plan: The 10-year ASCVD risk score (Arnett DK, et al., 2019) is: 1.8%   Values used to calculate the score:     Age: 59 years     Sex: Female     Is Non-Hispanic African American: No     Diabetic: No     Tobacco smoker: No     Systolic Blood Pressure: 126 mmHg     Is BP treated: Yes  HDL  Cholesterol: 41.9 mg/dL     Total Cholesterol: 203 mg/dL  Low cholesterol diet and exercise.  Follow lipid panel.   Orders: -     Lipid panel -     Hepatic function panel -     Hemoglobin A1c  Hypertension, essential Assessment & Plan: On carvedilol.  Blood pressure as outlined.  No changes in medication.  Follow.   Orders: -     Basic metabolic panel -     CBC with Differential/Platelet -     TSH  Hematuria, unspecified type Assessment & Plan: Recent hematuria.  Treated for UTI.  Recheck urinalysis today to confirm clear.   Orders: -     Urinalysis, Routine w reflex microscopic  Nonintractable headache, unspecified chronicity pattern, unspecified headache type Assessment & Plan: Has a history of migraine headaches.  On magnesium.  Discussed sleep and hormone changes.  Discussed stress.  Occasional headaches recently. Eye appt 04/11/23.    Stress Assessment & Plan: Overall appears to be handling things relatively well.  Follow. Will notify me if feels needs any further intervention.    IUD (intrauterine device) in place Assessment & Plan: Due f/u with gyn.  Plans to call to reschedule f/u.       Dale Exton, MD

## 2023-04-06 ENCOUNTER — Ambulatory Visit: Payer: BC Managed Care – PPO

## 2023-04-06 DIAGNOSIS — K640 First degree hemorrhoids: Secondary | ICD-10-CM | POA: Diagnosis not present

## 2023-04-06 DIAGNOSIS — Z1211 Encounter for screening for malignant neoplasm of colon: Secondary | ICD-10-CM | POA: Diagnosis present

## 2023-04-06 DIAGNOSIS — K3189 Other diseases of stomach and duodenum: Secondary | ICD-10-CM | POA: Diagnosis not present

## 2023-05-17 ENCOUNTER — Other Ambulatory Visit: Payer: Self-pay | Admitting: Internal Medicine

## 2023-05-23 ENCOUNTER — Other Ambulatory Visit: Payer: Self-pay | Admitting: Internal Medicine

## 2023-05-23 DIAGNOSIS — N63 Unspecified lump in unspecified breast: Secondary | ICD-10-CM

## 2023-05-23 DIAGNOSIS — Z1231 Encounter for screening mammogram for malignant neoplasm of breast: Secondary | ICD-10-CM

## 2023-06-23 ENCOUNTER — Ambulatory Visit
Admission: RE | Admit: 2023-06-23 | Discharge: 2023-06-23 | Disposition: A | Discharge status: Home / Self Care | Payer: BC Managed Care – PPO | Source: Ambulatory Visit | Attending: Internal Medicine | Admitting: Internal Medicine

## 2023-06-23 DIAGNOSIS — Z1231 Encounter for screening mammogram for malignant neoplasm of breast: Secondary | ICD-10-CM

## 2023-06-23 DIAGNOSIS — N63 Unspecified lump in unspecified breast: Secondary | ICD-10-CM | POA: Insufficient documentation

## 2023-07-03 NOTE — Progress Notes (Deleted)
 Subjective:    Patient ID: Cassandra Greene, female    DOB: 03/18/1976, 48 y.o.   MRN: [id number]  Patient here for No chief complaint on file.   HPI Here for a scheduled follow up - f/u regarding hypercholesterolemia and hypertension. Saw gyn 06/19/23 - pelvic/pap and IUD insertion.    Past Medical History:  Diagnosis Date   Allergy    Asthma    Fatigue    GERD (gastroesophageal reflux disease)    Heavy periods    Hyperlipidemia    Painful menstrual periods    Vitamin D  deficiency    Yeast infection of the vagina    No past surgical history on file. Family History  Problem Relation Age of Onset   Thyroid  disease Mother    Hypertension Mother    Cancer Mother    Hyperlipidemia Mother    Thyroid  disease Father    Hypertension Father    Cancer Father    Hyperlipidemia Father    Hearing loss Father    Thyroid  disease Maternal Aunt    Thyroid  disease Maternal Grandmother    Hypertension Maternal Grandmother    Heart disease Maternal Grandmother    Thyroid  disease Paternal Grandmother    Hypertension Brother    Kidney disease Brother    Alcohol abuse Maternal Grandfather    Stroke Maternal Grandfather    Heart disease Maternal Grandfather    Hearing loss Paternal Grandfather    Hypertension Paternal Grandfather    Hyperlipidemia Paternal Grandfather    Breast cancer Neg Hx    Social History   Socioeconomic History   Marital status: Married    Spouse name: Not on file   Number of children: 3   Years of education: Not on file   Highest education level: Bachelor's degree (e.g., BA, AB, BS)  Occupational History   Not on file  Tobacco Use   Smoking status: Never   Smokeless tobacco: Never  Vaping Use   Vaping status: Never Used  Substance and Sexual Activity   Alcohol use: Yes   Drug use: No   Sexual activity: Yes    Birth control/protection: I.U.D.    Comment: mirena   Other Topics Concern   Not on file  Social History Narrative   Not on file    Social Drivers of Health   Financial Resource Strain: Low Risk  (01/22/2021)   Overall Financial Resource Strain (CARDIA)    Difficulty of Paying Living Expenses: Not hard at all  Food Insecurity: Not on file  Transportation Needs: Not on file  Physical Activity: Not on file  Stress: Not on file  Social Connections: Not on file     Review of Systems     Objective:     There were no vitals taken for this visit. Wt Readings from Last 3 Encounters:  03/29/23 170 lb (77.1 kg)  02/15/23 172 lb (78 kg)  11/24/22 172 lb (78 kg)    Physical Exam   Outpatient Encounter Medications as of 07/04/2023  Medication Sig   carvedilol  (COREG ) 6.25 MG tablet TAKE ONE TABLET BY MOUTH TWICE DAILY WITH A MEAL   ibuprofen (ADVIL,MOTRIN) 200 MG tablet Take 200 mg by mouth every 6 (six) hours as needed.   lansoprazole (PREVACID) 15 MG capsule Take 30 mg by mouth daily.   levonorgestrel  (MIRENA ) 20 MCG/24HR IUD 1 each by Intrauterine route once.   magnesium  oxide (MAG-OX) 400 MG tablet TAKE 1 TABLET BY MOUTH DAILY   No facility-administered encounter medications  on file as of 07/04/2023.     Lab Results  Component Value Date   WBC 5.4 03/29/2023   HGB 14.4 03/29/2023   HCT 43.9 03/29/2023   PLT 272.0 03/29/2023   GLUCOSE 96 03/29/2023   CHOL 267 (H) 03/29/2023   TRIG 206.0 (H) 03/29/2023   HDL 47.50 03/29/2023   LDLCALC 178 (H) 03/29/2023   ALT 16 03/29/2023   AST 16 03/29/2023   NA 139 03/29/2023   K 4.3 03/29/2023   CL 107 03/29/2023   CREATININE 0.82 03/29/2023   BUN 23 03/29/2023   CO2 24 03/29/2023   TSH 2.19 03/29/2023   HGBA1C 5.3 03/29/2023    MM 3D DIAGNOSTIC MAMMOGRAM BILATERAL BREAST Result Date: 06/23/2023 CLINICAL DATA:  Follow-up of a probably benign mass at the right breast 9 o'clock position, favored to represent a complicated cyst/cluster of cysts, initially imaged August 2022. Patient is due for annual exam.  No breast complaints today. EXAM: DIGITAL DIAGNOSTIC  BILATERAL MAMMOGRAM WITH TOMOSYNTHESIS AND CAD; ULTRASOUND RIGHT BREAST LIMITED TECHNIQUE: Bilateral digital diagnostic mammography and breast tomosynthesis was performed. The images were evaluated with computer-aided detection. ; Targeted ultrasound examination of the right breast was performed COMPARISON:  Previous exam(s). ACR Breast Density Category b: There are scattered areas of fibroglandular density. FINDINGS: The previously described mass in the outer right breast is far less conspicuous on today's imaging. No new suspicious mass, calcification or other finding in either breast. Targeted ultrasound of the right breast at the 9 o'clock position 7 cm from the nipple demonstrates a cluster of oval anechoic circumscribed masses spanning 1.0 x 0.2 x 0.7 cm, previously measuring 1.6 x 0.4 x 1.1 cm on initial ultrasound 02/04/2021. IMPRESSION: 1. Interval decreased size of the cystic mass at the right breast 9 o'clock position, which is therefore considered benign. No additional dedicated follow-up imaging is indicated. 2. No mammographic findings of malignancy in either breast. RECOMMENDATION: Annual screening mammogram in 1 year. I have discussed the findings and recommendations with the patient. If applicable, a reminder letter will be sent to the patient regarding the next appointment. BI-RADS CATEGORY  2: Benign. Electronically Signed   By: Leita Mattocks M.D.   On: 06/23/2023 11:47   US  LIMITED ULTRASOUND INCLUDING AXILLA RIGHT BREAST Result Date: 06/23/2023 CLINICAL DATA:  Follow-up of a probably benign mass at the right breast 9 o'clock position, favored to represent a complicated cyst/cluster of cysts, initially imaged August 2022. Patient is due for annual exam.  No breast complaints today. EXAM: DIGITAL DIAGNOSTIC BILATERAL MAMMOGRAM WITH TOMOSYNTHESIS AND CAD; ULTRASOUND RIGHT BREAST LIMITED TECHNIQUE: Bilateral digital diagnostic mammography and breast tomosynthesis was performed. The images were  evaluated with computer-aided detection. ; Targeted ultrasound examination of the right breast was performed COMPARISON:  Previous exam(s). ACR Breast Density Category b: There are scattered areas of fibroglandular density. FINDINGS: The previously described mass in the outer right breast is far less conspicuous on today's imaging. No new suspicious mass, calcification or other finding in either breast. Targeted ultrasound of the right breast at the 9 o'clock position 7 cm from the nipple demonstrates a cluster of oval anechoic circumscribed masses spanning 1.0 x 0.2 x 0.7 cm, previously measuring 1.6 x 0.4 x 1.1 cm on initial ultrasound 02/04/2021. IMPRESSION: 1. Interval decreased size of the cystic mass at the right breast 9 o'clock position, which is therefore considered benign. No additional dedicated follow-up imaging is indicated. 2. No mammographic findings of malignancy in either breast. RECOMMENDATION: Annual screening mammogram  in 1 year. I have discussed the findings and recommendations with the patient. If applicable, a reminder letter will be sent to the patient regarding the next appointment. BI-RADS CATEGORY  2: Benign. Electronically Signed   By: Leita Mattocks M.D.   On: 06/23/2023 11:47       Assessment & Plan:  There are no diagnoses linked to this encounter.   Allena Hamilton, MD

## 2023-07-04 ENCOUNTER — Ambulatory Visit (INDEPENDENT_AMBULATORY_CARE_PROVIDER_SITE_OTHER): Payer: BC Managed Care – PPO | Admitting: Internal Medicine

## 2023-07-04 ENCOUNTER — Encounter: Payer: Self-pay | Admitting: Internal Medicine

## 2023-07-04 DIAGNOSIS — I10 Essential (primary) hypertension: Secondary | ICD-10-CM

## 2023-07-04 DIAGNOSIS — E7800 Pure hypercholesterolemia, unspecified: Secondary | ICD-10-CM

## 2023-07-04 NOTE — Progress Notes (Signed)
 Patient ID: Cassandra Greene, female   DOB: [birth year], 48 y.o.   MRN: [id number] Did not show/canceled appt

## 2023-08-21 ENCOUNTER — Ambulatory Visit: Payer: Self-pay | Admitting: Internal Medicine

## 2023-10-18 ENCOUNTER — Encounter (INDEPENDENT_AMBULATORY_CARE_PROVIDER_SITE_OTHER): Payer: Self-pay

## 2023-10-18 ENCOUNTER — Ambulatory Visit: Payer: Self-pay | Admitting: Internal Medicine

## 2023-10-18 ENCOUNTER — Encounter: Payer: Self-pay | Admitting: Internal Medicine

## 2023-10-18 VITALS — BP 124/74 | HR 76 | Temp 97.9°F | Resp 16 | Ht 61.0 in | Wt 176.2 lb

## 2023-10-18 DIAGNOSIS — F439 Reaction to severe stress, unspecified: Secondary | ICD-10-CM

## 2023-10-18 DIAGNOSIS — I10 Essential (primary) hypertension: Secondary | ICD-10-CM

## 2023-10-18 DIAGNOSIS — R519 Headache, unspecified: Secondary | ICD-10-CM

## 2023-10-18 DIAGNOSIS — E7800 Pure hypercholesterolemia, unspecified: Secondary | ICD-10-CM

## 2023-10-18 DIAGNOSIS — Z713 Dietary counseling and surveillance: Secondary | ICD-10-CM | POA: Diagnosis not present

## 2023-10-18 DIAGNOSIS — Z136 Encounter for screening for cardiovascular disorders: Secondary | ICD-10-CM | POA: Insufficient documentation

## 2023-10-18 DIAGNOSIS — R319 Hematuria, unspecified: Secondary | ICD-10-CM | POA: Diagnosis not present

## 2023-10-18 DIAGNOSIS — Z124 Encounter for screening for malignant neoplasm of cervix: Secondary | ICD-10-CM

## 2023-10-18 DIAGNOSIS — Z975 Presence of (intrauterine) contraceptive device: Secondary | ICD-10-CM

## 2023-10-18 LAB — URINALYSIS, ROUTINE W REFLEX MICROSCOPIC
Bilirubin Urine: NEGATIVE
Ketones, ur: NEGATIVE
Nitrite: NEGATIVE
Specific Gravity, Urine: 1.025 (ref 1.000–1.030)
Total Protein, Urine: NEGATIVE
Urine Glucose: NEGATIVE
Urobilinogen, UA: 0.2 (ref 0.0–1.0)
pH: 6 (ref 5.0–8.0)

## 2023-10-18 LAB — LIPID PANEL
Cholesterol: 257 mg/dL — ABNORMAL HIGH (ref 0–200)
HDL: 44.8 mg/dL (ref >39.00)
LDL Cholesterol: 171 mg/dL — ABNORMAL HIGH (ref 0–99)
NonHDL: 211.93
Total CHOL/HDL Ratio: 6
Triglycerides: 203 mg/dL — ABNORMAL HIGH (ref 0.0–149.0)
VLDL: 40.6 mg/dL — ABNORMAL HIGH (ref 0.0–40.0)

## 2023-10-18 LAB — HEPATIC FUNCTION PANEL
ALT: 17 U/L (ref 0–35)
AST: 18 U/L (ref 0–37)
Albumin: 4.5 g/dL (ref 3.5–5.2)
Alkaline Phosphatase: 58 U/L (ref 39–117)
Bilirubin, Direct: 0.1 mg/dL (ref 0.0–0.3)
Total Bilirubin: 1 mg/dL (ref 0.2–1.2)
Total Protein: 7.3 g/dL (ref 6.0–8.3)

## 2023-10-18 LAB — BASIC METABOLIC PANEL WITH GFR
BUN: 23 mg/dL (ref 6–23)
CO2: 25 meq/L (ref 19–32)
Calcium: 9.3 mg/dL (ref 8.4–10.5)
Chloride: 105 meq/L (ref 96–112)
Creatinine, Ser: 0.81 mg/dL (ref 0.40–1.20)
GFR: 86.72 mL/min (ref >60.00)
Glucose, Bld: 103 mg/dL — ABNORMAL HIGH (ref 70–99)
Potassium: 4 meq/L (ref 3.5–5.1)
Sodium: 137 meq/L (ref 135–145)

## 2023-10-18 MED ORDER — CARVEDILOL 6.25 MG PO TABS: ORAL_TABLET | ORAL | 1 refills | Status: DC

## 2023-10-18 NOTE — Assessment & Plan Note (Signed)
 The 10-year ASCVD risk score (Arnett DK, et al., 2019) is: 2.2%   Values used to calculate the score:     Age: 48 years     Sex: Female     Is Non-Hispanic African American: No     Diabetic: No     Tobacco smoker: No     Systolic Blood Pressure: 124 mmHg     Is BP treated: Yes     HDL Cholesterol: 47.5 mg/dL     Total Cholesterol: 267 mg/dL  Low cholesterol diet and exercise.  Follow lipid panel.  Check lipid panel today.

## 2023-10-18 NOTE — Assessment & Plan Note (Addendum)
 On carvedilol .  Blood pressure as outlined.  No changes in medication. Follow. Check metabolic panel today.

## 2023-10-18 NOTE — Assessment & Plan Note (Signed)
 She is exercising and watching her diet. Frustration with no weight loss. Refer to weight management.

## 2023-10-18 NOTE — Assessment & Plan Note (Signed)
 Saw Dr Alvia Awkward 06/19/23 - IUD replaced. PAP - ok.

## 2023-10-18 NOTE — Assessment & Plan Note (Signed)
 Discussed CT calcium score. Will notify me if desires to schedule.

## 2023-10-18 NOTE — Assessment & Plan Note (Signed)
 Has a history of migraine headaches. Continues on magnesium . Stable.

## 2023-10-18 NOTE — Assessment & Plan Note (Signed)
 Saw Dr Alvia Awkward as outlined. PAP ok.

## 2023-10-18 NOTE — Assessment & Plan Note (Signed)
Overall appears to be doing relatively well.  Follow.   

## 2023-10-18 NOTE — Progress Notes (Signed)
 Subjective:    Patient ID: Cassandra Greene, female    DOB: Jun 11, 1976, 48 y.o.   MRN: [id number]  Patient here for  Chief Complaint  Patient presents with   Medical Management of Chronic Issues    HPI Here for a scheduled follow up - follow up regarding hypercholesterolemia and hypertension. Has been dealing with increased stress. Also history of migraines. Saw Dr Alvia Awkward 06/19/23 - IUD replaced. PAP - ok. Reports she is doing well. Job is going well. Handling stress. Blood pressure doing well. She is exercising. No chest pain or sob reported. No cough or congestion. No abdominal pain or bowel change reported. Going to the gym 4 days per week. Watching diet. Frustrated at not losing weight. Discussed referral to weight management. Father just had stents. Discussed with her regarding calcium score. Also she request information regarding a new dermatologist.    Past Medical History:  Diagnosis Date   Allergy    Asthma    Fatigue    GERD (gastroesophageal reflux disease)    Heavy periods    Hyperlipidemia    Painful menstrual periods    Vitamin D  deficiency    Yeast infection of the vagina    History reviewed. No pertinent surgical history. Family History  Problem Relation Age of Onset   Thyroid  disease Mother    Hypertension Mother    Cancer Mother    Hyperlipidemia Mother    Thyroid  disease Father    Hypertension Father    Cancer Father    Hyperlipidemia Father    Hearing loss Father    Thyroid  disease Maternal Aunt    Thyroid  disease Maternal Grandmother    Hypertension Maternal Grandmother    Heart disease Maternal Grandmother    Thyroid  disease Paternal Grandmother    Hypertension Brother    Kidney disease Brother    Alcohol abuse Maternal Grandfather    Stroke Maternal Grandfather    Heart disease Maternal Grandfather    Hearing loss Paternal Grandfather    Hypertension Paternal Grandfather    Hyperlipidemia Paternal Grandfather    Breast cancer Neg Hx     Social History   Socioeconomic History   Marital status: Married    Spouse name: Not on file   Number of children: 3   Years of education: Not on file   Highest education level: Bachelor's degree (e.g., BA, AB, BS)  Occupational History   Not on file  Tobacco Use   Smoking status: Never   Smokeless tobacco: Never  Vaping Use   Vaping status: Never Used  Substance and Sexual Activity   Alcohol use: Yes   Drug use: No   Sexual activity: Yes    Birth control/protection: I.U.D.    Comment: mirena   Other Topics Concern   Not on file  Social History Narrative   Not on file   Social Drivers of Health   Financial Resource Strain: Low Risk  (01/22/2021)   Overall Financial Resource Strain (CARDIA)    Difficulty of Paying Living Expenses: Not hard at all  Food Insecurity: Not on file  Transportation Needs: Not on file  Physical Activity: Not on file  Stress: Not on file  Social Connections: Not on file     Review of Systems  Constitutional:  Negative for appetite change and unexpected weight change.  HENT:  Negative for congestion and sinus pressure.   Respiratory:  Negative for cough, chest tightness and shortness of breath.   Cardiovascular:  Negative for chest pain, palpitations  and leg swelling.  Gastrointestinal:  Negative for abdominal pain, diarrhea, nausea and vomiting.  Genitourinary:  Negative for difficulty urinating and dysuria.  Musculoskeletal:  Negative for joint swelling and myalgias.  Skin:  Negative for color change and rash.  Neurological:  Negative for dizziness and headaches.  Psychiatric/Behavioral:  Negative for agitation and dysphoric mood.        Objective:     BP 124/74   Pulse 76   Temp 97.9 F (36.6 C)   Resp 16   Ht 5\' 1"  (1.549 m)   Wt 176 lb 3.2 oz (79.9 kg)   SpO2 98%   BMI 33.29 kg/m  Wt Readings from Last 3 Encounters:  10/18/23 176 lb 3.2 oz (79.9 kg)  03/29/23 170 lb (77.1 kg)  02/15/23 172 lb (78 kg)    Physical  Exam Vitals reviewed.  Constitutional:      General: She is not in acute distress.    Appearance: Normal appearance.  HENT:     Head: Normocephalic and atraumatic.     Right Ear: External ear normal.     Left Ear: External ear normal.     Mouth/Throat:     Pharynx: No oropharyngeal exudate or posterior oropharyngeal erythema.  Eyes:     General: No scleral icterus.       Right eye: No discharge.        Left eye: No discharge.     Conjunctiva/sclera: Conjunctivae normal.  Neck:     Thyroid : No thyromegaly.  Cardiovascular:     Rate and Rhythm: Normal rate and regular rhythm.  Pulmonary:     Effort: No respiratory distress.     Breath sounds: Normal breath sounds. No wheezing.  Abdominal:     General: Bowel sounds are normal.     Palpations: Abdomen is soft.     Tenderness: There is no abdominal tenderness.  Musculoskeletal:        General: No swelling or tenderness.     Cervical back: Neck supple. No tenderness.  Lymphadenopathy:     Cervical: No cervical adenopathy.  Skin:    Findings: No erythema or rash.  Neurological:     Mental Status: She is alert.  Psychiatric:        Mood and Affect: Mood normal.        Behavior: Behavior normal.         Outpatient Encounter Medications as of 10/18/2023  Medication Sig   Vitamin D -Vitamin K (D3 + K2 PO) Take by mouth.   carvedilol  (COREG ) 6.25 MG tablet TAKE ONE TABLET BY MOUTH TWICE DAILY WITH A MEAL   ibuprofen (ADVIL,MOTRIN) 200 MG tablet Take 200 mg by mouth every 6 (six) hours as needed.   lansoprazole (PREVACID) 15 MG capsule Take 30 mg by mouth daily.   levonorgestrel  (MIRENA ) 20 MCG/24HR IUD 1 each by Intrauterine route once.   MAGNESIUM  COMPLEX HIGH POTENCY PO Take by mouth.   [DISCONTINUED] carvedilol  (COREG ) 6.25 MG tablet TAKE ONE TABLET BY MOUTH TWICE DAILY WITH A MEAL   [DISCONTINUED] magnesium  oxide (MAG-OX) 400 MG tablet TAKE 1 TABLET BY MOUTH DAILY   No facility-administered encounter medications on file  as of 10/18/2023.     Lab Results  Component Value Date   WBC 5.4 03/29/2023   HGB 14.4 03/29/2023   HCT 43.9 03/29/2023   PLT 272.0 03/29/2023   GLUCOSE 103 (H) 10/18/2023   CHOL 257 (H) 10/18/2023   TRIG 203.0 (H) 10/18/2023   HDL 44.80 10/18/2023  LDLCALC 171 (H) 10/18/2023   ALT 17 10/18/2023   AST 18 10/18/2023   NA 137 10/18/2023   K 4.0 10/18/2023   CL 105 10/18/2023   CREATININE 0.81 10/18/2023   BUN 23 10/18/2023   CO2 25 10/18/2023   TSH 2.19 03/29/2023   HGBA1C 5.3 03/29/2023    MM 3D DIAGNOSTIC MAMMOGRAM BILATERAL BREAST Result Date: 06/23/2023 CLINICAL DATA:  Follow-up of a probably benign mass at the right breast 9 o'clock position, favored to represent a complicated cyst/cluster of cysts, initially imaged August 2022. Patient is due for annual exam.  No breast complaints today. EXAM: DIGITAL DIAGNOSTIC BILATERAL MAMMOGRAM WITH TOMOSYNTHESIS AND CAD; ULTRASOUND RIGHT BREAST LIMITED TECHNIQUE: Bilateral digital diagnostic mammography and breast tomosynthesis was performed. The images were evaluated with computer-aided detection. ; Targeted ultrasound examination of the right breast was performed COMPARISON:  Previous exam(s). ACR Breast Density Category b: There are scattered areas of fibroglandular density. FINDINGS: The previously described mass in the outer right breast is far less conspicuous on today's imaging. No new suspicious mass, calcification or other finding in either breast. Targeted ultrasound of the right breast at the 9 o'clock position 7 cm from the nipple demonstrates a cluster of oval anechoic circumscribed masses spanning 1.0 x 0.2 x 0.7 cm, previously measuring 1.6 x 0.4 x 1.1 cm on initial ultrasound 02/04/2021. IMPRESSION: 1. Interval decreased size of the cystic mass at the right breast 9 o'clock position, which is therefore considered benign. No additional dedicated follow-up imaging is indicated. 2. No mammographic findings of malignancy in either  breast. RECOMMENDATION: Annual screening mammogram in 1 year. I have discussed the findings and recommendations with the patient. If applicable, a reminder letter will be sent to the patient regarding the next appointment. BI-RADS CATEGORY  2: Benign. Electronically Signed   By: Mercie Stalker M.D.   On: 06/23/2023 11:47   US  LIMITED ULTRASOUND INCLUDING AXILLA RIGHT BREAST Result Date: 06/23/2023 CLINICAL DATA:  Follow-up of a probably benign mass at the right breast 9 o'clock position, favored to represent a complicated cyst/cluster of cysts, initially imaged August 2022. Patient is due for annual exam.  No breast complaints today. EXAM: DIGITAL DIAGNOSTIC BILATERAL MAMMOGRAM WITH TOMOSYNTHESIS AND CAD; ULTRASOUND RIGHT BREAST LIMITED TECHNIQUE: Bilateral digital diagnostic mammography and breast tomosynthesis was performed. The images were evaluated with computer-aided detection. ; Targeted ultrasound examination of the right breast was performed COMPARISON:  Previous exam(s). ACR Breast Density Category b: There are scattered areas of fibroglandular density. FINDINGS: The previously described mass in the outer right breast is far less conspicuous on today's imaging. No new suspicious mass, calcification or other finding in either breast. Targeted ultrasound of the right breast at the 9 o'clock position 7 cm from the nipple demonstrates a cluster of oval anechoic circumscribed masses spanning 1.0 x 0.2 x 0.7 cm, previously measuring 1.6 x 0.4 x 1.1 cm on initial ultrasound 02/04/2021. IMPRESSION: 1. Interval decreased size of the cystic mass at the right breast 9 o'clock position, which is therefore considered benign. No additional dedicated follow-up imaging is indicated. 2. No mammographic findings of malignancy in either breast. RECOMMENDATION: Annual screening mammogram in 1 year. I have discussed the findings and recommendations with the patient. If applicable, a reminder letter will be sent to the patient  regarding the next appointment. BI-RADS CATEGORY  2: Benign. Electronically Signed   By: Mercie Stalker M.D.   On: 06/23/2023 11:47       Assessment & Plan:  Hematuria, unspecified  type Assessment & Plan: Previous UTI. Last urine revealed no red blood cells. Previously noticed increased gross hematuria. Recheck urine to confirm clear.   Orders: -     Urinalysis, Routine w reflex microscopic  Hypercholesterolemia Assessment & Plan: The 10-year ASCVD risk score (Arnett DK, et al., 2019) is: 2.2%   Values used to calculate the score:     Age: 71 years     Sex: Female     Is Non-Hispanic African American: No     Diabetic: No     Tobacco smoker: No     Systolic Blood Pressure: 124 mmHg     Is BP treated: Yes     HDL Cholesterol: 47.5 mg/dL     Total Cholesterol: 267 mg/dL  Low cholesterol diet and exercise.  Follow lipid panel.  Check lipid panel today.   Orders: -     Lipid panel -     Hepatic function panel  Hypertension, essential Assessment & Plan: On carvedilol .  Blood pressure as outlined.  No changes in medication. Follow. Check metabolic panel today.   Orders: -     Basic metabolic panel with GFR  Weight loss counseling, encounter for Assessment & Plan: She is exercising and watching her diet. Frustration with no weight loss. Refer to weight management.   Orders: -     Amb Ref to Medical Weight Management  Cervical cancer screening Assessment & Plan: Saw Dr Alvia Awkward as outlined. PAP ok.    IUD (intrauterine device) in place Assessment & Plan: Saw Dr Alvia Awkward 06/19/23 - IUD replaced. PAP - ok.   Nonintractable headache, unspecified chronicity pattern, unspecified headache type Assessment & Plan: Has a history of migraine headaches. Continues on magnesium . Stable.    Stress Assessment & Plan: Overall appears to be doing relatively well. Follow.    Encounter for screening for coronary artery disease Assessment & Plan: Discussed CT calcium score. Will  notify me if desires to schedule.    Other orders -     Carvedilol ; TAKE ONE TABLET BY MOUTH TWICE DAILY WITH A MEAL  Dispense: 180 tablet; Refill: 1     Dellar Fenton, MD

## 2023-10-18 NOTE — Assessment & Plan Note (Addendum)
 Previous UTI. Last urine revealed no red blood cells. Previously noticed increased gross hematuria. Recheck urine to confirm clear.

## 2023-10-19 ENCOUNTER — Other Ambulatory Visit: Payer: Self-pay | Admitting: Internal Medicine

## 2023-10-19 DIAGNOSIS — R319 Hematuria, unspecified: Secondary | ICD-10-CM

## 2023-10-19 NOTE — Progress Notes (Signed)
 Order placed for urology referral.

## 2023-10-20 NOTE — Progress Notes (Signed)
 Noted.

## 2023-12-08 ENCOUNTER — Ambulatory Visit: Admitting: Urology

## 2024-01-14 ENCOUNTER — Encounter: Payer: Self-pay | Admitting: Internal Medicine

## 2024-01-15 ENCOUNTER — Ambulatory Visit: Admitting: Urology

## 2024-01-15 ENCOUNTER — Encounter: Payer: Self-pay | Admitting: Urology

## 2024-01-15 VITALS — BP 114/78 | HR 87 | Ht 62.0 in | Wt 171.0 lb

## 2024-01-15 DIAGNOSIS — R399 Unspecified symptoms and signs involving the genitourinary system: Secondary | ICD-10-CM | POA: Diagnosis not present

## 2024-01-15 DIAGNOSIS — R3129 Other microscopic hematuria: Secondary | ICD-10-CM

## 2024-01-15 LAB — URINALYSIS, COMPLETE
Bilirubin, UA: NEGATIVE
Glucose, UA: NEGATIVE
Ketones, UA: NEGATIVE
Leukocytes,UA: NEGATIVE
Nitrite, UA: NEGATIVE
Protein,UA: NEGATIVE
Specific Gravity, UA: <1.005 — ABNORMAL LOW (ref 1.005–1.030)
Urobilinogen, Ur: 0.2 mg/dL (ref 0.2–1.0)
pH, UA: 6 (ref 5.0–7.5)

## 2024-01-15 LAB — MICROSCOPIC EXAMINATION
Bacteria, UA: NONE SEEN
Epithelial Cells (non renal): NONE SEEN /HPF (ref 0–10)

## 2024-01-15 NOTE — Patient Instructions (Signed)
[phone].    

## 2024-01-15 NOTE — Telephone Encounter (Signed)
 Called patient. She is not having any pain, tenderness, swelling. Redness is staying localized to the left lower leg (side of calf, approx 4 in long). Patient says that she is not concerned of cellulitis or DVT. She has been scheduled tomorrow for you to look at her leg. Any acute changes, will be evaluated sooner.

## 2024-01-15 NOTE — Progress Notes (Signed)
 I, Cassandra Greene, acting as a scribe for Glendia JAYSON Barba, MD., have documented all relevant documentation on the behalf of Glendia JAYSON Barba, MD, as directed by Glendia JAYSON Barba, MD while in the presence of Glendia JAYSON Barba, MD.  01/15/2024 3:06 PM   Cassandra Greene 03-29-1976 [id number]  Referring provider: Glendia Shad, MD [address] [place],  [place] [address]  Chief Complaint  Patient presents with   Hematuria    HPI: Cassandra Greene is a 48 y.o. female referred for evaluation of hematuria.  Intermittent lower urinary tract symptoms since August 2024 with urinary frequency, malodorous urine, urgency and urinary hesitancy. Urinalysis at that time was unremarkable. Her urinalysis showed 3-6 WBCs and urine culture was positive for E. coli. Intermittent symptoms since that time. Urinalysis October 2024 was negative, and a repeat urinalysis April 2025 showed 11-20 WBC/3-6 RBC, however there were 5-50 squamous epithelial cells.  No fever, chills, nausea or vomiting   PMH: Past Medical History:  Diagnosis Date   Allergy    Asthma    Fatigue    GERD (gastroesophageal reflux disease)    Heavy periods    Hyperlipidemia    Painful menstrual periods    Vitamin D  deficiency    Yeast infection of the vagina     Home Medications:  Allergies as of 01/15/2024   No Known Allergies      Medication List        Accurate as of January 15, 2024  3:06 PM. If you have any questions, ask your nurse or doctor.          carvedilol  6.25 MG tablet Commonly known as: COREG  TAKE ONE TABLET BY MOUTH TWICE DAILY WITH A MEAL   D3 + K2 PO Take by mouth.   ibuprofen 200 MG tablet Commonly known as: ADVIL Take 200 mg by mouth every 6 (six) hours as needed.   lansoprazole 15 MG capsule Commonly known as: PREVACID Take 30 mg by mouth daily.   levonorgestrel  20 MCG/24HR IUD Commonly known as: MIRENA  1 each by Intrauterine route once.   MAGNESIUM  COMPLEX  HIGH POTENCY PO Take by mouth.        Allergies: No Known Allergies  Family History: Family History  Problem Relation Age of Onset   Thyroid  disease Mother    Hypertension Mother    Cancer Mother    Hyperlipidemia Mother    Thyroid  disease Father    Hypertension Father    Cancer Father    Hyperlipidemia Father    Hearing loss Father    Thyroid  disease Maternal Aunt    Thyroid  disease Maternal Grandmother    Hypertension Maternal Grandmother    Heart disease Maternal Grandmother    Thyroid  disease Paternal Grandmother    Hypertension Brother    Kidney disease Brother    Alcohol abuse Maternal Grandfather    Stroke Maternal Grandfather    Heart disease Maternal Grandfather    Hearing loss Paternal Grandfather    Hypertension Paternal Grandfather    Hyperlipidemia Paternal Grandfather    Breast cancer Neg Hx     Social History:  reports that she has never smoked. She has never used smokeless tobacco. She reports current alcohol use. She reports that she does not use drugs.   Physical Exam: BP 114/78   Pulse 87   Ht 5' 2 (1.575 m)   Wt 171 lb (77.6 kg)   BMI 31.28 kg/m   Constitutional:  Alert and  oriented, No acute distress. HEENT: [place] AT Respiratory: Normal respiratory effort, no increased work of breathing. Psychiatric: Normal mood and affect.   Urinalysis Dipstick 1+ blood/microscopy negative.    Assessment & Plan:    1. Lower urinary tract symptoms Frequency, urgency, and urinary hesitancy.  Schedule CT renal stone study to evaluate for the possibility of a distal ureteral calculus.  If CT negative and symptoms persist, will schedule cystoscopy.  I have reviewed the above documentation for accuracy and completeness, and I agree with the above.   Glendia JAYSON Barba, MD  [facility] [address], [address] [place], [place] [address] [phone]

## 2024-01-16 ENCOUNTER — Encounter: Payer: Self-pay | Admitting: Internal Medicine

## 2024-01-16 ENCOUNTER — Ambulatory Visit: Admitting: Internal Medicine

## 2024-01-16 VITALS — BP 124/74 | HR 74 | Temp 98.0°F | Resp 16 | Ht 62.0 in | Wt 172.0 lb

## 2024-01-16 DIAGNOSIS — I10 Essential (primary) hypertension: Secondary | ICD-10-CM | POA: Diagnosis not present

## 2024-01-16 DIAGNOSIS — R21 Rash and other nonspecific skin eruption: Secondary | ICD-10-CM

## 2024-01-16 MED ORDER — TRIAMCINOLONE ACETONIDE 0.1 % EX CREA: 1.0000 | TOPICAL_CREAM | Freq: Two times a day (BID) | CUTANEOUS | 0 refills | Status: DC

## 2024-01-16 NOTE — Assessment & Plan Note (Signed)
On carvedilol.  Blood pressure as outlined.

## 2024-01-16 NOTE — Progress Notes (Signed)
 Subjective:    Patient ID: Cassandra Greene, female    DOB: 26-Mar-1976, 48 y.o.   MRN: [id number]  Patient here for  Chief Complaint  Patient presents with   redness in lower leg    HPI Here for work in appt - work in with concerns regarding rash - lower left leg.  She reports increased physical activity last week at the beach.  Was doing a lot of exercise.  1 day walked 11 [address].  Up on her feet and legs a lot.  Noticed approximately 1 week ago some redness that extended up her posterior ankle to the lower posterior calf.  No tenderness.  Noticed no significant increase swelling.  Minimal swelling right around the rash.  No injury or trauma.  No other rash.  Feels good.  No fever.  No increased joint aches.  She is still staying physically active but since she has been back from the beach this is actually improved.  Otherwise doing well.  Has lost weight.  Stress improved during the summer.   Past Medical History:  Diagnosis Date   Allergy    Asthma    Fatigue    GERD (gastroesophageal reflux disease)    Heavy periods    Hyperlipidemia    Painful menstrual periods    Vitamin D  deficiency    Yeast infection of the vagina    History reviewed. No pertinent surgical history. Family History  Problem Relation Age of Onset   Thyroid  disease Mother    Hypertension Mother    Cancer Mother    Hyperlipidemia Mother    Thyroid  disease Father    Hypertension Father    Cancer Father    Hyperlipidemia Father    Hearing loss Father    Thyroid  disease Maternal Aunt    Thyroid  disease Maternal Grandmother    Hypertension Maternal Grandmother    Heart disease Maternal Grandmother    Thyroid  disease Paternal Grandmother    Hypertension Brother    Kidney disease Brother    Alcohol abuse Maternal Grandfather    Stroke Maternal Grandfather    Heart disease Maternal Grandfather    Hearing loss Paternal Grandfather    Hypertension Paternal Grandfather    Hyperlipidemia Paternal Grandfather     Breast cancer Neg Hx    Social History   Socioeconomic History   Marital status: Married    Spouse name: Not on file   Number of children: 3   Years of education: Not on file   Highest education level: Bachelor's degree (e.g., BA, AB, BS)  Occupational History   Not on file  Tobacco Use   Smoking status: Never   Smokeless tobacco: Never  Vaping Use   Vaping status: Never Used  Substance and Sexual Activity   Alcohol use: Yes   Drug use: No   Sexual activity: Yes    Birth control/protection: I.U.D.    Comment: mirena   Other Topics Concern   Not on file  Social History Narrative   Not on file   Social Drivers of Health   Financial Resource Strain: Low Risk  (01/22/2021)   Overall Financial Resource Strain (CARDIA)    Difficulty of Paying Living Expenses: Not hard at all  Food Insecurity: Not on file  Transportation Needs: Not on file  Physical Activity: Not on file  Stress: Not on file  Social Connections: Not on file     Review of Systems  Constitutional:  Negative for appetite change, fever and unexpected weight change.  HENT:  Negative for congestion and sinus pressure.   Respiratory:  Negative for cough, chest tightness and shortness of breath.   Cardiovascular:  Negative for chest pain and palpitations.       No increased swelling.   Gastrointestinal:  Negative for abdominal pain, diarrhea, nausea and vomiting.  Genitourinary:  Negative for difficulty urinating and dysuria.  Musculoskeletal:  Negative for joint swelling and myalgias.  Skin:  Negative for color change and rash.  Neurological:  Negative for dizziness and headaches.  Psychiatric/Behavioral:  Negative for agitation and dysphoric mood.        Objective:     BP 124/74   Pulse 74   Temp 98 F (36.7 C)   Resp 16   Ht 5' 2 (1.575 m)   Wt 172 lb (78 kg)   SpO2 99%   BMI 31.46 kg/m  Wt Readings from Last 3 Encounters:  01/16/24 172 lb (78 kg)  01/15/24 171 lb (77.6 kg)  10/18/23 176  lb 3.2 oz (79.9 kg)    Physical Exam Vitals reviewed.  Constitutional:      General: She is not in acute distress.    Appearance: Normal appearance.  HENT:     Head: Normocephalic and atraumatic.     Right Ear: External ear normal.     Left Ear: External ear normal.     Mouth/Throat:     Pharynx: No oropharyngeal exudate or posterior oropharyngeal erythema.  Eyes:     General: No scleral icterus.       Right eye: No discharge.        Left eye: No discharge.     Conjunctiva/sclera: Conjunctivae normal.  Neck:     Thyroid : No thyromegaly.  Cardiovascular:     Rate and Rhythm: Normal rate and regular rhythm.  Pulmonary:     Effort: No respiratory distress.     Breath sounds: Normal breath sounds. No wheezing.  Abdominal:     General: Bowel sounds are normal.     Palpations: Abdomen is soft.     Tenderness: There is no abdominal tenderness.  Musculoskeletal:        General: No swelling or tenderness.     Cervical back: Neck supple. No tenderness.     Comments: DP pulses palpable and equal bilaterally.   Lymphadenopathy:     Cervical: No cervical adenopathy.  Skin:    Findings: No erythema.     Comments: Erythema - posterior - ankle to lower calf.  No pain. No increased swelling.   Neurological:     Mental Status: She is alert.  Psychiatric:        Mood and Affect: Mood normal.        Behavior: Behavior normal.         Outpatient Encounter Medications as of 01/16/2024  Medication Sig   triamcinolone  cream (KENALOG ) 0.1 % Apply 1 Application topically 2 (two) times daily.   carvedilol  (COREG ) 6.25 MG tablet TAKE ONE TABLET BY MOUTH TWICE DAILY WITH A MEAL   ibuprofen (ADVIL,MOTRIN) 200 MG tablet Take 200 mg by mouth every 6 (six) hours as needed.   lansoprazole (PREVACID) 30 MG capsule Take 30 mg by mouth daily.   levonorgestrel  (MIRENA ) 20 MCG/24HR IUD 1 each by Intrauterine route once.   MAGNESIUM  COMPLEX HIGH POTENCY PO Take by mouth.   Vitamin D -Vitamin K (D3 +  K2 PO) Take by mouth.   [DISCONTINUED] lansoprazole (PREVACID) 15 MG capsule Take 30 mg by mouth daily.  No facility-administered encounter medications on file as of 01/16/2024.     Lab Results  Component Value Date   WBC 5.4 03/29/2023   HGB 14.4 03/29/2023   HCT 43.9 03/29/2023   PLT 272.0 03/29/2023   GLUCOSE 103 (H) 10/18/2023   CHOL 257 (H) 10/18/2023   TRIG 203.0 (H) 10/18/2023   HDL 44.80 10/18/2023   LDLCALC 171 (H) 10/18/2023   ALT 17 10/18/2023   AST 18 10/18/2023   NA 137 10/18/2023   K 4.0 10/18/2023   CL 105 10/18/2023   CREATININE 0.81 10/18/2023   BUN 23 10/18/2023   CO2 25 10/18/2023   TSH 2.19 03/29/2023   HGBA1C 5.3 03/29/2023    MM 3D DIAGNOSTIC MAMMOGRAM BILATERAL BREAST Result Date: 06/23/2023 CLINICAL DATA:  Follow-up of a probably benign mass at the right breast 9 o'clock position, favored to represent a complicated cyst/cluster of cysts, initially imaged August 2022. Patient is due for annual exam.  No breast complaints today. EXAM: DIGITAL DIAGNOSTIC BILATERAL MAMMOGRAM WITH TOMOSYNTHESIS AND CAD; ULTRASOUND RIGHT BREAST LIMITED TECHNIQUE: Bilateral digital diagnostic mammography and breast tomosynthesis was performed. The images were evaluated with computer-aided detection. ; Targeted ultrasound examination of the right breast was performed COMPARISON:  Previous exam(s). ACR Breast Density Category b: There are scattered areas of fibroglandular density. FINDINGS: The previously described mass in the outer right breast is far less conspicuous on today's imaging. No new suspicious mass, calcification or other finding in either breast. Targeted ultrasound of the right breast at the 9 o'clock position 7 cm from the nipple demonstrates a cluster of oval anechoic circumscribed masses spanning 1.0 x 0.2 x 0.7 cm, previously measuring 1.6 x 0.4 x 1.1 cm on initial ultrasound 02/04/2021. IMPRESSION: 1. Interval decreased size of the cystic mass at the right breast 9  o'clock position, which is therefore considered benign. No additional dedicated follow-up imaging is indicated. 2. No mammographic findings of malignancy in either breast. RECOMMENDATION: Annual screening mammogram in 1 year. I have discussed the findings and recommendations with the patient. If applicable, a reminder letter will be sent to the patient regarding the next appointment. BI-RADS CATEGORY  2: Benign. Electronically Signed   By: Leita Mattocks M.D.   On: 06/23/2023 11:47   US  LIMITED ULTRASOUND INCLUDING AXILLA RIGHT BREAST Result Date: 06/23/2023 CLINICAL DATA:  Follow-up of a probably benign mass at the right breast 9 o'clock position, favored to represent a complicated cyst/cluster of cysts, initially imaged August 2022. Patient is due for annual exam.  No breast complaints today. EXAM: DIGITAL DIAGNOSTIC BILATERAL MAMMOGRAM WITH TOMOSYNTHESIS AND CAD; ULTRASOUND RIGHT BREAST LIMITED TECHNIQUE: Bilateral digital diagnostic mammography and breast tomosynthesis was performed. The images were evaluated with computer-aided detection. ; Targeted ultrasound examination of the right breast was performed COMPARISON:  Previous exam(s). ACR Breast Density Category b: There are scattered areas of fibroglandular density. FINDINGS: The previously described mass in the outer right breast is far less conspicuous on today's imaging. No new suspicious mass, calcification or other finding in either breast. Targeted ultrasound of the right breast at the 9 o'clock position 7 cm from the nipple demonstrates a cluster of oval anechoic circumscribed masses spanning 1.0 x 0.2 x 0.7 cm, previously measuring 1.6 x 0.4 x 1.1 cm on initial ultrasound 02/04/2021. IMPRESSION: 1. Interval decreased size of the cystic mass at the right breast 9 o'clock position, which is therefore considered benign. No additional dedicated follow-up imaging is indicated. 2. No mammographic findings of malignancy in either breast.  RECOMMENDATION:  Annual screening mammogram in 1 year. I have discussed the findings and recommendations with the patient. If applicable, a reminder letter will be sent to the patient regarding the next appointment. BI-RADS CATEGORY  2: Benign. Electronically Signed   By: Leita Mattocks M.D.   On: 06/23/2023 11:47       Assessment & Plan:  Rash Assessment & Plan: Localized to left lower leg - posterior. Does not appear to be c/w cellulitis. No increased swelling or warmth. Question of stasis dermatitis. Improved. Leg elevation when sitting. Triamcinolone  cream as directed. Follow. Call with update.    Hypertension, essential Assessment & Plan: On carvedilol .  Blood pressure as outlined.     Other orders -     Triamcinolone  Acetonide; Apply 1 Application topically 2 (two) times daily.  Dispense: 30 g; Refill: 0     Allena Hamilton, MD

## 2024-01-16 NOTE — Assessment & Plan Note (Signed)
 Localized to left lower leg - posterior. Does not appear to be c/w cellulitis. No increased swelling or warmth. Question of stasis dermatitis. Improved. Leg elevation when sitting. Triamcinolone  cream as directed. Follow. Call with update.

## 2024-01-23 ENCOUNTER — Encounter: Payer: Self-pay | Admitting: Internal Medicine

## 2024-04-16 ENCOUNTER — Ambulatory Visit: Payer: Self-pay

## 2024-04-16 NOTE — Telephone Encounter (Signed)
 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

## 2024-04-16 NOTE — Telephone Encounter (Signed)
 FYI Only or Action Required?: Action required by provider: clinical question for provider.   Patient was last seen in primary care on 01/16/2024 by Glendia Shad, MD.  Called Nurse Triage reporting Sore Throat.  Symptoms began several days ago.  Interventions attempted: OTC medications: tylenol, ibuprofen, sudafed.  Symptoms are: gradually worsening.  Triage Disposition: See Physician Within 24 Hours  Patient/caregiver understands and will follow disposition?: No, wishes to speak with PCP   Pt wondering if she can stop by today to be swabbed after work as she has an appt on Thursday. RN also recommended UC      Copied from CRM [id number]. Topic: Clinical - Red Word Triage >> Apr 16, 2024  9:10 AM Roselie BROCKS wrote: [facility] that prompted transfer to Nurse Triage: Patient states she thinks she may  have strep throat or covid, States her throat  is in severe pain , can't barely stand it. Reason for Disposition  SEVERE throat pain (e.g., excruciating)  Answer Assessment - Initial Assessment Questions Pt states that she would typically just deal with it but she wants to make sure it's not strep and that she doesn't need an antibiotic. 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 She states it is very red and so painful she can't sleep at night. She states it brings her to tears between 2-5am. She's been taking 2 extra strength tylenol and 3 ibuprofen around the clock.     1. ONSET: When did the throat start hurting? (Hours or days ago)      Friday 2. SEVERITY: How bad is the sore throat? (Scale 1-10; mild, moderate or severe)     At 2am 10, currently tolerable 3. STREP EXPOSURE: Has there been any exposure to strep within the past week? If Yes, ask: What type of contact occurred?      School nurse- covid going around 4.  VIRAL SYMPTOMS: Are there any symptoms of a cold, such as a runny nose, cough, hoarse voice or red eyes?      Post nasal  drip 5. FEVER: Do you have a fever? If Yes, ask: What is your temperature, how was it measured, and when did it start?     No fever 6. PUS ON THE TONSILS: Is there pus on the tonsils in the back of your throat?     no 7. OTHER SYMPTOMS: Do you have any other symptoms? (e.g., difficulty breathing, headache, rash) Slight headache  Protocols used: Sore Throat-A-AH

## 2024-04-18 ENCOUNTER — Ambulatory Visit: Payer: Self-pay | Admitting: Internal Medicine

## 2024-04-18 ENCOUNTER — Encounter: Payer: Self-pay | Admitting: Internal Medicine

## 2024-04-18 ENCOUNTER — Ambulatory Visit: Admitting: Internal Medicine

## 2024-04-18 VITALS — BP 120/80 | HR 67 | Temp 97.8°F | Ht 62.0 in | Wt 173.8 lb

## 2024-04-18 DIAGNOSIS — F439 Reaction to severe stress, unspecified: Secondary | ICD-10-CM

## 2024-04-18 DIAGNOSIS — R319 Hematuria, unspecified: Secondary | ICD-10-CM

## 2024-04-18 DIAGNOSIS — J329 Chronic sinusitis, unspecified: Secondary | ICD-10-CM

## 2024-04-18 DIAGNOSIS — I10 Essential (primary) hypertension: Secondary | ICD-10-CM | POA: Diagnosis not present

## 2024-04-18 DIAGNOSIS — J029 Acute pharyngitis, unspecified: Secondary | ICD-10-CM

## 2024-04-18 DIAGNOSIS — E7800 Pure hypercholesterolemia, unspecified: Secondary | ICD-10-CM

## 2024-04-18 LAB — POCT RAPID STREP A (OFFICE): Rapid Strep A Screen: NEGATIVE

## 2024-04-18 LAB — POCT COVID BINAXNOW CARD: SARS Coronavirus 2 Ag: NEGATIVE

## 2024-04-18 MED ORDER — CARVEDILOL 6.25 MG PO TABS: ORAL_TABLET | ORAL | 1 refills | Status: AC

## 2024-04-18 MED ORDER — AMOXICILLIN 875 MG PO TABS: 875.0000 mg | ORAL_TABLET | Freq: Two times a day (BID) | ORAL | 0 refills | Status: AC

## 2024-04-18 NOTE — Assessment & Plan Note (Signed)
Overall appears to be handling things relatively well.  Follow.   

## 2024-04-18 NOTE — Patient Instructions (Signed)
 Nasacort nasal spray - 2 sprays each nostril one time per day.   Saline nasal spray - flush nose 1-2 x/day.

## 2024-04-18 NOTE — Assessment & Plan Note (Signed)
 On carvedilol . Blood pressure as outlined. No changes in medication today.

## 2024-04-18 NOTE — Progress Notes (Signed)
 Subjective:    Patient ID: Cassandra Greene, female    DOB: Feb 09, 1976, 48 y.o.   MRN: [id number]  Patient here for  Chief Complaint  Patient presents with   Medical Management of Chronic Issues    6 mth f/u   Sore Throat    Since Friday-C/O right ear pressure, PND, and some nasal congestion. She is a school nurse    HPI Here for a scheduled follow up - follow up regarding hypertension and hypercholesterolemia. Saw urology 12/2023. Had recommended CT renal stone study - lower tract symptoms and hematuria. Reports having a severe sore throat. Has been taking sudafed, tylenol and ibuprofen. Started 04/12/24. No fever. Some headache. Started with scratchy throat. No fever. Increased pain - throat. Increased post nasal drainage. Minimal nasal congestion. No chest congestion. Minimal cough. No vomiting or diarrhea. Has been taking tylenol and ibuprofen. Eating soup. Drinking hot tea. Took sudafed. Some ear pressure. No abdominal pain or urinary symptoms.    Past Medical History:  Diagnosis Date   Allergy    Asthma    Fatigue    GERD (gastroesophageal reflux disease)    Heavy periods    Hyperlipidemia    Painful menstrual periods    Vitamin D  deficiency    Yeast infection of the vagina    History reviewed. No pertinent surgical history. Family History  Problem Relation Age of Onset   Thyroid  disease Mother    Hypertension Mother    Cancer Mother    Hyperlipidemia Mother    Thyroid  disease Father    Hypertension Father    Cancer Father    Hyperlipidemia Father    Hearing loss Father    Thyroid  disease Maternal Aunt    Thyroid  disease Maternal Grandmother    Hypertension Maternal Grandmother    Heart disease Maternal Grandmother    Thyroid  disease Paternal Grandmother    Hypertension Brother    Kidney disease Brother    Alcohol abuse Maternal Grandfather    Stroke Maternal Grandfather    Heart disease Maternal Grandfather    Hearing loss Paternal Grandfather     Hypertension Paternal Grandfather    Hyperlipidemia Paternal Grandfather    Breast cancer Neg Hx    Social History   Socioeconomic History   Marital status: Married    Spouse name: Not on file   Number of children: 3   Years of education: Not on file   Highest education level: Bachelor's degree (e.g., BA, AB, BS)  Occupational History   Not on file  Tobacco Use   Smoking status: Never   Smokeless tobacco: Never  Vaping Use   Vaping status: Never Used  Substance and Sexual Activity   Alcohol use: Yes   Drug use: No   Sexual activity: Yes    Birth control/protection: I.U.D.    Comment: mirena   Other Topics Concern   Not on file  Social History Narrative   Not on file   Social Drivers of Health   Financial Resource Strain: Low Risk  (01/22/2021)   Overall Financial Resource Strain (CARDIA)    Difficulty of Paying Living Expenses: Not hard at all  Food Insecurity: Not on file  Transportation Needs: Not on file  Physical Activity: Not on file  Stress: Not on file  Social Connections: Not on file     Review of Systems  Constitutional:  Negative for appetite change, fever and unexpected weight change.  HENT:  Positive for congestion, postnasal drip and sore throat.  Respiratory:  Negative for chest tightness and shortness of breath.        Minimal cough. No chest congestion.   Cardiovascular:  Negative for chest pain, palpitations and leg swelling.  Gastrointestinal:  Negative for abdominal pain, diarrhea, nausea and vomiting.  Genitourinary:  Positive for urgency. Negative for difficulty urinating, dysuria and menstrual problem.  Musculoskeletal:  Negative for joint swelling and myalgias.  Skin:  Negative for color change and rash.  Neurological:  Negative for dizziness and headaches.  Psychiatric/Behavioral:  Negative for agitation and dysphoric mood.        Objective:     BP 120/80 (Cuff Size: Normal)   Pulse 67   Temp 97.8 F (36.6 C) (Oral)   Ht 5' 2  (1.575 m)   Wt 173 lb 12 oz (78.8 kg)   SpO2 97%   BMI 31.78 kg/m  Wt Readings from Last 3 Encounters:  04/18/24 173 lb 12 oz (78.8 kg)  01/16/24 172 lb (78 kg)  01/15/24 171 lb (77.6 kg)    Physical Exam Vitals reviewed.  Constitutional:      General: She is not in acute distress.    Appearance: Normal appearance.  HENT:     Head: Normocephalic and atraumatic.     Right Ear: Tympanic membrane and external ear normal. Tympanic membrane is not erythematous.     Left Ear: Tympanic membrane and external ear normal. Tympanic membrane is not erythematous.  Eyes:     General: No scleral icterus.       Right eye: No discharge.        Left eye: No discharge.     Conjunctiva/sclera: Conjunctivae normal.  Neck:     Thyroid : No thyromegaly.  Cardiovascular:     Rate and Rhythm: Normal rate and regular rhythm.  Pulmonary:     Effort: No respiratory distress.     Breath sounds: Normal breath sounds. No wheezing.  Abdominal:     General: Bowel sounds are normal.     Palpations: Abdomen is soft.     Tenderness: There is no abdominal tenderness.  Musculoskeletal:        General: No swelling or tenderness.     Cervical back: Neck supple. No tenderness.  Lymphadenopathy:     Cervical: No cervical adenopathy.  Skin:    Findings: No erythema or rash.  Neurological:     Mental Status: She is alert.  Psychiatric:        Mood and Affect: Mood normal.        Behavior: Behavior normal.         Outpatient Encounter Medications as of 04/18/2024  Medication Sig   amoxicillin (AMOXIL) 875 MG tablet Take 1 tablet (875 mg total) by mouth 2 (two) times daily for 10 days.   ibuprofen (ADVIL,MOTRIN) 200 MG tablet Take 200 mg by mouth every 6 (six) hours as needed.   lansoprazole (PREVACID) 30 MG capsule Take 30 mg by mouth daily.   levonorgestrel  (MIRENA ) 20 MCG/24HR IUD 1 each by Intrauterine route once.   MAGNESIUM  COMPLEX HIGH POTENCY PO Take by mouth.   Vitamin D -Vitamin K (D3 + K2 PO)  Take by mouth.   carvedilol  (COREG ) 6.25 MG tablet TAKE ONE TABLET BY MOUTH TWICE DAILY WITH A MEAL   [DISCONTINUED] carvedilol  (COREG ) 6.25 MG tablet TAKE ONE TABLET BY MOUTH TWICE DAILY WITH A MEAL   [DISCONTINUED] triamcinolone  cream (KENALOG ) 0.1 % Apply 1 Application topically 2 (two) times daily.   No facility-administered encounter medications  on file as of 04/18/2024.     Lab Results  Component Value Date   WBC 5.4 03/29/2023   HGB 14.4 03/29/2023   HCT 43.9 03/29/2023   PLT 272.0 03/29/2023   GLUCOSE 103 (H) 10/18/2023   CHOL 257 (H) 10/18/2023   TRIG 203.0 (H) 10/18/2023   HDL 44.80 10/18/2023   LDLCALC 171 (H) 10/18/2023   ALT 17 10/18/2023   AST 18 10/18/2023   NA 137 10/18/2023   K 4.0 10/18/2023   CL 105 10/18/2023   CREATININE 0.81 10/18/2023   BUN 23 10/18/2023   CO2 25 10/18/2023   TSH 2.19 03/29/2023   HGBA1C 5.3 03/29/2023    MM 3D DIAGNOSTIC MAMMOGRAM BILATERAL BREAST Result Date: 06/23/2023 CLINICAL DATA:  Follow-up of a probably benign mass at the right breast 9 o'clock position, favored to represent a complicated cyst/cluster of cysts, initially imaged August 2022. Patient is due for annual exam.  No breast complaints today. EXAM: DIGITAL DIAGNOSTIC BILATERAL MAMMOGRAM WITH TOMOSYNTHESIS AND CAD; ULTRASOUND RIGHT BREAST LIMITED TECHNIQUE: Bilateral digital diagnostic mammography and breast tomosynthesis was performed. The images were evaluated with computer-aided detection. ; Targeted ultrasound examination of the right breast was performed COMPARISON:  Previous exam(s). ACR Breast Density Category b: There are scattered areas of fibroglandular density. FINDINGS: The previously described mass in the outer right breast is far less conspicuous on today's imaging. No new suspicious mass, calcification or other finding in either breast. Targeted ultrasound of the right breast at the 9 o'clock position 7 cm from the nipple demonstrates a cluster of oval anechoic  circumscribed masses spanning 1.0 x 0.2 x 0.7 cm, previously measuring 1.6 x 0.4 x 1.1 cm on initial ultrasound 02/04/2021. IMPRESSION: 1. Interval decreased size of the cystic mass at the right breast 9 o'clock position, which is therefore considered benign. No additional dedicated follow-up imaging is indicated. 2. No mammographic findings of malignancy in either breast. RECOMMENDATION: Annual screening mammogram in 1 year. I have discussed the findings and recommendations with the patient. If applicable, a reminder letter will be sent to the patient regarding the next appointment. BI-RADS CATEGORY  2: Benign. Electronically Signed   By: Leita Mattocks M.D.   On: 06/23/2023 11:47   US  LIMITED ULTRASOUND INCLUDING AXILLA RIGHT BREAST Result Date: 06/23/2023 CLINICAL DATA:  Follow-up of a probably benign mass at the right breast 9 o'clock position, favored to represent a complicated cyst/cluster of cysts, initially imaged August 2022. Patient is due for annual exam.  No breast complaints today. EXAM: DIGITAL DIAGNOSTIC BILATERAL MAMMOGRAM WITH TOMOSYNTHESIS AND CAD; ULTRASOUND RIGHT BREAST LIMITED TECHNIQUE: Bilateral digital diagnostic mammography and breast tomosynthesis was performed. The images were evaluated with computer-aided detection. ; Targeted ultrasound examination of the right breast was performed COMPARISON:  Previous exam(s). ACR Breast Density Category b: There are scattered areas of fibroglandular density. FINDINGS: The previously described mass in the outer right breast is far less conspicuous on today's imaging. No new suspicious mass, calcification or other finding in either breast. Targeted ultrasound of the right breast at the 9 o'clock position 7 cm from the nipple demonstrates a cluster of oval anechoic circumscribed masses spanning 1.0 x 0.2 x 0.7 cm, previously measuring 1.6 x 0.4 x 1.1 cm on initial ultrasound 02/04/2021. IMPRESSION: 1. Interval decreased size of the cystic mass at the  right breast 9 o'clock position, which is therefore considered benign. No additional dedicated follow-up imaging is indicated. 2. No mammographic findings of malignancy in either breast. RECOMMENDATION: Annual screening  mammogram in 1 year. I have discussed the findings and recommendations with the patient. If applicable, a reminder letter will be sent to the patient regarding the next appointment. BI-RADS CATEGORY  2: Benign. Electronically Signed   By: Leita Mattocks M.D.   On: 06/23/2023 11:47       Assessment & Plan:  Hematuria, unspecified type Assessment & Plan: Saw Dr Twylla. Had recommended CT renal stone study. Recheck urine today.   Orders: -     Urinalysis, Routine w reflex microscopic; Future  Hypertension, essential Assessment & Plan: On carvedilol . Blood pressure as outlined. No changes in medication today.   Orders: -     Basic metabolic panel with GFR; Future -     CBC with Differential/Platelet; Future  Hypercholesterolemia Assessment & Plan: The 10-year ASCVD risk score (Arnett DK, et al., 2019) is: 2.2%   Values used to calculate the score:     Age: 42 years     Clincally relevant sex: Female     Is Non-Hispanic African American: No     Diabetic: No     Tobacco smoker: No     Systolic Blood Pressure: 120 mmHg     Is BP treated: Yes     HDL Cholesterol: 44.8 mg/dL     Total Cholesterol: 257 mg/dL  Low cholesterol diet and exercise.  Follow lipid panel.   Orders: -     Hepatic function panel; Future -     Lipid panel; Future -     TSH; Future  Stress Assessment & Plan: Overall appears to be handling things relatively well. Follow.    Acute sore throat -     POCT rapid strep A -     POCT COVID BINAX NOW CARD  Sinusitis, unspecified chronicity, unspecified location Assessment & Plan: Symptoms appear to be c/w sinus infection. Has tried otc medications. Treat with saline nasal spray and steroid nasal spray as directed. Amoxicillin as directed. Follow.  Call with update.    Other orders -     Carvedilol ; TAKE ONE TABLET BY MOUTH TWICE DAILY WITH A MEAL  Dispense: 180 tablet; Refill: 1 -     Amoxicillin; Take 1 tablet (875 mg total) by mouth 2 (two) times daily for 10 days.  Dispense: 20 tablet; Refill: 0     Allena Hamilton, MD

## 2024-04-18 NOTE — Assessment & Plan Note (Signed)
 Saw Dr Twylla. Had recommended CT renal stone study. Recheck urine today.

## 2024-04-22 ENCOUNTER — Encounter: Payer: Self-pay | Admitting: Internal Medicine

## 2024-04-22 DIAGNOSIS — J329 Chronic sinusitis, unspecified: Secondary | ICD-10-CM | POA: Insufficient documentation

## 2024-04-22 NOTE — Assessment & Plan Note (Signed)
 The 10-year ASCVD risk score (Arnett DK, et al., 2019) is: 2.2%   Values used to calculate the score:     Age: 48 years     Clincally relevant sex: Female     Is Non-Hispanic African American: No     Diabetic: No     Tobacco smoker: No     Systolic Blood Pressure: 120 mmHg     Is BP treated: Yes     HDL Cholesterol: 44.8 mg/dL     Total Cholesterol: 257 mg/dL  Low cholesterol diet and exercise.  Follow lipid panel.

## 2024-04-22 NOTE — Assessment & Plan Note (Signed)
 Symptoms appear to be c/w sinus infection. Has tried otc medications. Treat with saline nasal spray and steroid nasal spray as directed. Amoxicillin as directed. Follow. Call with update.

## 2024-05-22 ENCOUNTER — Other Ambulatory Visit

## 2024-05-29 ENCOUNTER — Telehealth: Payer: Self-pay | Admitting: Internal Medicine

## 2024-05-29 NOTE — Telephone Encounter (Unsigned)
 Copied from CRM [id number]. Topic: Clinical - Medication Refill >> May 29, 2024 12:58 PM Mesmerise C wrote: Medication:  lansoprazole (PREVACID) 30 MG capsule   Has the patient contacted their pharmacy? Yes (Agent: If no, request that the patient contact the pharmacy for the refill. If patient does not wish to contact the pharmacy document the reason why and proceed with request.) (Agent: If yes, when and what did the pharmacy advise?) Yes used to get it by colonoscopy but no longer sees provider since not in office  This is the patient's preferred pharmacy:  [facility] - [place], [place] - [address] [place] [address] Phone: [phone] Fax: [phone]  Is this the correct pharmacy for this prescription? Yes If no, delete pharmacy and type the correct one.   Has the prescription been filled recently? No  Is the patient out of the medication? Yes  Has the patient been seen for an appointment in the last year OR does the patient have an upcoming appointment? Yes  Can we respond through MyChart? Yes  Agent: Please be advised that Rx refills may take up to 3 business days. We ask that you follow-up with your pharmacy.

## 2024-05-30 MED ORDER — LANSOPRAZOLE 30 MG PO CPDR: 30.0000 mg | DELAYED_RELEASE_CAPSULE | Freq: Every day | ORAL | 1 refills | Status: AC

## 2024-05-30 NOTE — Telephone Encounter (Signed)
 Rx sent in for prevacid. My chart message sent to Cassandra Greene.

## 2024-06-17 ENCOUNTER — Other Ambulatory Visit

## 2024-07-05 ENCOUNTER — Other Ambulatory Visit (INDEPENDENT_AMBULATORY_CARE_PROVIDER_SITE_OTHER)

## 2024-07-05 DIAGNOSIS — R319 Hematuria, unspecified: Secondary | ICD-10-CM | POA: Diagnosis not present

## 2024-07-05 DIAGNOSIS — I10 Essential (primary) hypertension: Secondary | ICD-10-CM

## 2024-07-05 DIAGNOSIS — E7800 Pure hypercholesterolemia, unspecified: Secondary | ICD-10-CM | POA: Diagnosis not present

## 2024-07-05 LAB — BASIC METABOLIC PANEL WITH GFR
BUN: 22 mg/dL (ref 6–23)
CO2: 28 meq/L (ref 19–32)
Calcium: 9.7 mg/dL (ref 8.4–10.5)
Chloride: 102 meq/L (ref 96–112)
Creatinine, Ser: 0.77 mg/dL (ref 0.40–1.20)
GFR: 91.69 mL/min
Glucose, Bld: 85 mg/dL (ref 70–99)
Potassium: 4.4 meq/L (ref 3.5–5.1)
Sodium: 137 meq/L (ref 135–145)

## 2024-07-05 LAB — TSH: TSH: 1.81 u[IU]/mL (ref 0.35–5.50)

## 2024-07-05 LAB — URINALYSIS, ROUTINE W REFLEX MICROSCOPIC
Bilirubin Urine: NEGATIVE
Ketones, ur: NEGATIVE
Leukocytes,Ua: NEGATIVE
Nitrite: NEGATIVE
Specific Gravity, Urine: <=1.005 — AB (ref 1.000–1.030)
Total Protein, Urine: NEGATIVE
Urine Glucose: NEGATIVE
Urobilinogen, UA: 0.2 (ref 0.0–1.0)
pH: 6.5 (ref 5.0–8.0)

## 2024-07-05 LAB — CBC WITH DIFFERENTIAL/PLATELET
Basophils Absolute: 0.1 K/uL (ref 0.0–0.1)
Basophils Relative: 0.9 % (ref 0.0–3.0)
Eosinophils Absolute: 0.1 K/uL (ref 0.0–0.7)
Eosinophils Relative: 2.2 % (ref 0.0–5.0)
HCT: 43.6 % (ref 36.0–46.0)
Hemoglobin: 14.9 g/dL (ref 12.0–15.0)
Lymphocytes Relative: 39.6 % (ref 12.0–46.0)
Lymphs Abs: 2.3 K/uL (ref 0.7–4.0)
MCHC: 34.1 g/dL (ref 30.0–36.0)
MCV: 88.6 fl (ref 78.0–100.0)
Monocytes Absolute: 0.4 K/uL (ref 0.1–1.0)
Monocytes Relative: 6.5 % (ref 3.0–12.0)
Neutro Abs: 2.9 K/uL (ref 1.4–7.7)
Neutrophils Relative %: 50.8 % (ref 43.0–77.0)
Platelets: 273 K/uL (ref 150.0–400.0)
RBC: 4.92 Mil/uL (ref 3.87–5.11)
RDW: 12.8 % (ref 11.5–15.5)
WBC: 5.7 K/uL (ref 4.0–10.5)

## 2024-07-05 LAB — LIPID PANEL
Cholesterol: 259 mg/dL — ABNORMAL HIGH (ref 28–200)
HDL: 43.8 mg/dL
LDL Cholesterol: 187 mg/dL — ABNORMAL HIGH (ref 10–99)
NonHDL: 215.32
Total CHOL/HDL Ratio: 6
Triglycerides: 141 mg/dL (ref 10.0–149.0)
VLDL: 28.2 mg/dL (ref 0.0–40.0)

## 2024-07-05 LAB — HEPATIC FUNCTION PANEL
ALT: 19 U/L (ref 3–35)
AST: 19 U/L (ref 5–37)
Albumin: 4.9 g/dL (ref 3.5–5.2)
Alkaline Phosphatase: 63 U/L (ref 39–117)
Bilirubin, Direct: 0.1 mg/dL (ref 0.1–0.3)
Total Bilirubin: 1.1 mg/dL (ref 0.2–1.2)
Total Protein: 7.3 g/dL (ref 6.0–8.3)

## 2024-08-20 ENCOUNTER — Encounter: Admitting: Internal Medicine
